# Patient Record
Sex: Female | Born: 1960 | Hispanic: Yes | Smoking: Never smoker
Health system: Southern US, Community
[De-identification: ages and names within clinical notes are randomized; demographics above are authoritative.]

## PROBLEM LIST (undated history)

## (undated) DIAGNOSIS — Z0000 Encounter for general adult medical examination without abnormal findings: Secondary | ICD-10-CM

## (undated) DIAGNOSIS — R748 Abnormal levels of other serum enzymes: Secondary | ICD-10-CM

## (undated) DIAGNOSIS — R002 Palpitations: Secondary | ICD-10-CM

## (undated) DIAGNOSIS — R739 Hyperglycemia, unspecified: Secondary | ICD-10-CM

## (undated) DIAGNOSIS — E663 Overweight: Secondary | ICD-10-CM

## (undated) DIAGNOSIS — Z8742 Personal history of other diseases of the female genital tract: Secondary | ICD-10-CM

## (undated) DIAGNOSIS — E288 Other ovarian dysfunction: Secondary | ICD-10-CM

## (undated) DIAGNOSIS — E2839 Other primary ovarian failure: Secondary | ICD-10-CM

## (undated) DIAGNOSIS — D649 Anemia, unspecified: Secondary | ICD-10-CM

## (undated) DIAGNOSIS — E538 Deficiency of other specified B group vitamins: Secondary | ICD-10-CM

## (undated) DIAGNOSIS — E079 Disorder of thyroid, unspecified: Secondary | ICD-10-CM

## (undated) DIAGNOSIS — N809 Endometriosis, unspecified: Secondary | ICD-10-CM

## (undated) DIAGNOSIS — R768 Other specified abnormal immunological findings in serum: Secondary | ICD-10-CM

## (undated) DIAGNOSIS — H409 Unspecified glaucoma: Secondary | ICD-10-CM

## (undated) DIAGNOSIS — R7689 Other specified abnormal immunological findings in serum: Secondary | ICD-10-CM

## (undated) DIAGNOSIS — M5126 Other intervertebral disc displacement, lumbar region: Secondary | ICD-10-CM

## (undated) DIAGNOSIS — Z124 Encounter for screening for malignant neoplasm of cervix: Secondary | ICD-10-CM

## (undated) DIAGNOSIS — H04123 Dry eye syndrome of bilateral lacrimal glands: Secondary | ICD-10-CM

## (undated) DIAGNOSIS — M797 Fibromyalgia: Secondary | ICD-10-CM

## (undated) HISTORY — DX: Endometriosis, unspecified: N80.9

## (undated) HISTORY — DX: Anemia, unspecified: D64.9

## (undated) HISTORY — DX: Other primary ovarian failure: E28.39

## (undated) HISTORY — DX: Other specified abnormal immunological findings in serum: R76.8

## (undated) HISTORY — DX: Other specified abnormal immunological findings in serum: R76.89

## (undated) HISTORY — DX: Hyperglycemia, unspecified: R73.9

## (undated) HISTORY — DX: Deficiency of other specified B group vitamins: E53.8

## (undated) HISTORY — DX: Unspecified glaucoma: H40.9

## (undated) HISTORY — DX: Dry eye syndrome of bilateral lacrimal glands: H04.123

## (undated) HISTORY — DX: Fibromyalgia: M79.7

## (undated) HISTORY — DX: Other ovarian dysfunction: E28.8

## (undated) HISTORY — DX: Encounter for general adult medical examination without abnormal findings: Z00.00

## (undated) HISTORY — DX: Disorder of thyroid, unspecified: E07.9

## (undated) HISTORY — DX: Abnormal levels of other serum enzymes: R74.8

## (undated) HISTORY — DX: Other intervertebral disc displacement, lumbar region: M51.26

## (undated) HISTORY — DX: Palpitations: R00.2

## (undated) HISTORY — DX: Overweight: E66.3

## (undated) HISTORY — DX: Personal history of other diseases of the female genital tract: Z87.42

## (undated) HISTORY — DX: Encounter for screening for malignant neoplasm of cervix: Z12.4

---

## 1988-03-07 HISTORY — PX: SALPINGOOPHORECTOMY: SHX82

## 1990-12-05 DIAGNOSIS — G9332 Myalgic encephalomyelitis/chronic fatigue syndrome: Secondary | ICD-10-CM | POA: Insufficient documentation

## 1990-12-05 HISTORY — DX: Myalgic encephalomyelitis/chronic fatigue syndrome: G93.32

## 2007-04-26 ENCOUNTER — Encounter (INDEPENDENT_AMBULATORY_CARE_PROVIDER_SITE_OTHER): Payer: Self-pay | Admitting: *Deleted

## 2007-05-03 ENCOUNTER — Ambulatory Visit: Payer: Self-pay | Admitting: Family Medicine

## 2007-05-03 DIAGNOSIS — S139XXA Sprain of joints and ligaments of unspecified parts of neck, initial encounter: Secondary | ICD-10-CM | POA: Insufficient documentation

## 2007-05-25 ENCOUNTER — Ambulatory Visit: Payer: Self-pay | Admitting: Family Medicine

## 2007-05-25 DIAGNOSIS — R519 Headache, unspecified: Secondary | ICD-10-CM | POA: Insufficient documentation

## 2007-05-25 DIAGNOSIS — R51 Headache: Secondary | ICD-10-CM | POA: Insufficient documentation

## 2007-05-28 ENCOUNTER — Encounter: Payer: Self-pay | Admitting: Family Medicine

## 2007-06-01 ENCOUNTER — Telehealth (INDEPENDENT_AMBULATORY_CARE_PROVIDER_SITE_OTHER): Payer: Self-pay | Admitting: *Deleted

## 2014-02-14 ENCOUNTER — Ambulatory Visit (INDEPENDENT_AMBULATORY_CARE_PROVIDER_SITE_OTHER): Payer: BC Managed Care – PPO

## 2014-02-14 ENCOUNTER — Encounter: Payer: Self-pay | Admitting: Sports Medicine

## 2014-02-14 ENCOUNTER — Ambulatory Visit (INDEPENDENT_AMBULATORY_CARE_PROVIDER_SITE_OTHER): Payer: BC Managed Care – PPO | Admitting: Sports Medicine

## 2014-02-14 VITALS — BP 105/66 | HR 66 | Ht 71.0 in | Wt 181.0 lb

## 2014-02-14 DIAGNOSIS — M79641 Pain in right hand: Secondary | ICD-10-CM

## 2014-02-14 DIAGNOSIS — M778 Other enthesopathies, not elsewhere classified: Secondary | ICD-10-CM

## 2014-02-14 DIAGNOSIS — M6588 Other synovitis and tenosynovitis, other site: Secondary | ICD-10-CM

## 2014-02-14 DIAGNOSIS — M79644 Pain in right finger(s): Secondary | ICD-10-CM | POA: Insufficient documentation

## 2014-02-14 DIAGNOSIS — M779 Enthesopathy, unspecified: Principal | ICD-10-CM

## 2014-02-14 MED ORDER — MELOXICAM 15 MG PO TABS: ORAL_TABLET | ORAL | Status: DC

## 2014-02-14 NOTE — Progress Notes (Signed)
   Subjective:    I'm seeing this patient as a consultation for:  Dr. Lelon Huh[facility]antel[facility] at [facility]  CC: Right hand pain  HPI: This is a very pleasant 53 year old left handed individual, for the past several weeks she's had an increasing pain that she localizes over the volar first metacarpal, worse with flexion and extension of the thumb. Pain is worsening, radiates from the interphalangeal joint to the base of the thenar eminence. Moderate in severity. No numbness or tingling, neck pain. No trauma.  Past medical history, Surgical history, Family history not pertinant except as noted below, Social history, Allergies, and medications have been entered into the medical record, reviewed, and no changes needed.   Review of Systems: No headache, visual changes, nausea, vomiting, diarrhea, constipation, dizziness, abdominal pain, skin rash, fevers, chills, night sweats, weight loss, swollen lymph nodes, body aches, joint swelling, muscle aches, chest pain, shortness of breath, mood changes, visual or auditory hallucinations.   Objective:   General: Well Developed, well nourished, and in no acute distress.  Neuro/Psych: Alert and oriented x3, extra-ocular muscles intact, able to move all 4 extremities, sensation grossly intact. Skin: Warm and dry, no rashes noted.  Respiratory: Not using accessory muscles, speaking in full sentences, trachea midline.  Cardiovascular: Pulses palpable, no extremity edema. Abdomen: Does not appear distended. Right hand: The thumb does appear swollen on the right side compared to the left, she has fallen active range of motion, excellent strength. I am able to reproduce pain with resisted flexion of the interphalangeal joint. She has a negative Finkelstein sign, negative Watson test, and no discrete tenderness to palpation at the interphalangeal, metacarpophalangeal or carpal metacarpal joints. She is neurovascularly intact distally. I am able to palpate  flexor pollicis longus tendon, it is tender to palpation, pain is concordant. There is no triggering.  Impression and Recommendations:   This case required medical decision making of moderate complexity.

## 2014-02-14 NOTE — Assessment & Plan Note (Signed)
We will start conservatively with thumb spica immobilization, meloxicam. I would also like some x-rays of the right hand.  Return in 2 weeks, injection if no better.

## 2014-03-03 ENCOUNTER — Ambulatory Visit (INDEPENDENT_AMBULATORY_CARE_PROVIDER_SITE_OTHER): Payer: BC Managed Care – PPO | Admitting: Sports Medicine

## 2014-03-03 ENCOUNTER — Encounter: Payer: Self-pay | Admitting: Sports Medicine

## 2014-03-03 VITALS — BP 120/66 | HR 64 | Ht 71.0 in | Wt 182.0 lb

## 2014-03-03 DIAGNOSIS — M654 Radial styloid tenosynovitis [de Quervain]: Secondary | ICD-10-CM

## 2014-03-03 NOTE — Assessment & Plan Note (Signed)
Persistent pain despite 2 weeks of immobilization in a thumb spica brace. Ultrasound guided first extensor compartment injection as above. Continue OT with her friend. Return in one month.

## 2014-03-03 NOTE — Progress Notes (Signed)
  Subjective:    CC: right wrist pain  HPI: This is a very pleasant 53 year old female, initially we diagnosed her with a flexor pollicis longus tendinitis, we placed her in a thumb spica brace, and she returns today with persistent pain however this time is localized over the dorsum of the wrist at the first extensor compartment. Pain is moderate, persistent. Burning in nature and radiates to the mid forearm.  Past medical history, Surgical history, Family history not pertinant except as noted below, Social history, Allergies, and medications have been entered into the medical record, reviewed, and no changes needed.   Review of Systems: No fevers, chills, night sweats, weight loss, chest pain, or shortness of breath.   Objective:    General: Well Developed, well nourished, and in no acute distress.  Neuro: Alert and oriented x3, extra-ocular muscles intact, sensation grossly intact.  HEENT: Normocephalic, atraumatic, pupils equal round reactive to light, neck supple, no masses, no lymphadenopathy, thyroid nonpalpable.  Skin: Warm and dry, no rashes. Cardiac: Regular rate and rhythm, no murmurs rubs or gallops, no lower extremity edema.  Respiratory: Clear to auscultation bilaterally. Not using accessory muscles, speaking in full sentences. Right Wrist: Inspection normal with no visible erythema or swelling. ROM smooth and normal with good flexion and extension and ulnar/radial deviation that is symmetrical with opposite wrist. Palpation is normal over metacarpals, navicular, lunate, and TFCC; tendons without tenderness/ swelling No snuffbox tenderness. No tenderness over Canal of Guyon. Strength 5/5 in all directions without pain. Negative Tinel's and Phalen's sign. Positive Finkelstein sign. Negative Watson's test.  Procedure: Real-time Ultrasound Guided Injection of right first extensor compartment Device: GE Logiq E  Verbal informed consent obtained.  Time-out conducted.  Noted  no overlying erythema, induration, or other signs of local infection.  Skin prepped in a sterile fashion.  Local anesthesia: Topical Ethyl chloride.  With sterile technique and under real time ultrasound guidance:  30-gauge needle advanced into the first extensor compartment which did have some edema, 1 mL kenalog 40, 2 mL lidocaine injected easily around the extensor pollicis brevis and abductor pollicis longus tendons taking care to avoid intratendinous injection. Completed without difficulty  Pain immediately resolved suggesting accurate placement of the medication.  Advised to call if fevers/chills, erythema, induration, drainage, or persistent bleeding.  Images permanently stored and available for review in the ultrasound unit.  Impression: Technically successful ultrasound guided injection.  Impression and Recommendations:

## 2014-03-31 ENCOUNTER — Ambulatory Visit: Payer: BC Managed Care – PPO | Admitting: Sports Medicine

## 2014-04-07 ENCOUNTER — Encounter: Payer: Self-pay | Admitting: Sports Medicine

## 2014-04-07 ENCOUNTER — Ambulatory Visit (INDEPENDENT_AMBULATORY_CARE_PROVIDER_SITE_OTHER): Payer: 59 | Admitting: Sports Medicine

## 2014-04-07 ENCOUNTER — Ambulatory Visit (INDEPENDENT_AMBULATORY_CARE_PROVIDER_SITE_OTHER): Payer: 59

## 2014-04-07 DIAGNOSIS — M79644 Pain in right finger(s): Secondary | ICD-10-CM

## 2014-04-07 DIAGNOSIS — M654 Radial styloid tenosynovitis [de Quervain]: Secondary | ICD-10-CM

## 2014-04-07 NOTE — Assessment & Plan Note (Signed)
First extensor compartment pain was completely resolved until a fall about 2 weeks ago. She continues to be pain free with regards to her de Quervain's tendinitis. She now has a recurrence of pain at the first interphalangeal as well as the metacarpal phalangeal joints. X-rays, referral to therapy for phonophoresis and iontophoresis. Return to see me in a month.

## 2014-04-07 NOTE — Progress Notes (Signed)
  Subjective:    CC: Follow-up  HPI: Right wrist pain: We injected her first extensor compartment at the last visit and she completely improved, unfortunately she had a fall 2 weeks ago with a recurrence of pain and she now localizes at the first interphalangeal and metacarpophalangeal joints, she tells me she had significant swelling, pain, which has steadily improved. Pain is now mild, improving.  Past medical history, Surgical history, Family history not pertinant except as noted below, Social history, Allergies, and medications have been entered into the medical record, reviewed, and no changes needed.   Review of Systems: No fevers, chills, night sweats, weight loss, chest pain, or shortness of breath.   Objective:    General: Well Developed, well nourished, and in no acute distress.  Neuro: Alert and oriented x3, extra-ocular muscles intact, sensation grossly intact.  HEENT: Normocephalic, atraumatic, pupils equal round reactive to light, neck supple, no masses, no lymphadenopathy, thyroid nonpalpable.  Skin: Warm and dry, no rashes. Cardiac: Regular rate and rhythm, no murmurs rubs or gallops, no lower extremity edema.  Respiratory: Clear to auscultation bilaterally. Not using accessory muscles, speaking in full sentences. Right hand: No tenderness over the radial styloid, with a negative Finkelstein sign. She does have pain with decreased range of motion and visible swelling in the right first interphalangeal joint, and minimal pain at the right first metacarpal phalangeal joint.  Impression and Recommendations:

## 2014-04-09 ENCOUNTER — Encounter: Payer: Self-pay | Admitting: Family

## 2014-04-09 ENCOUNTER — Ambulatory Visit (INDEPENDENT_AMBULATORY_CARE_PROVIDER_SITE_OTHER): Payer: 59 | Admitting: Family

## 2014-04-09 VITALS — BP 112/64 | HR 66 | Temp 98.0°F | Resp 16 | Ht 70.5 in | Wt 179.0 lb

## 2014-04-09 DIAGNOSIS — M5442 Lumbago with sciatica, left side: Secondary | ICD-10-CM

## 2014-04-09 DIAGNOSIS — R945 Abnormal results of liver function studies: Secondary | ICD-10-CM

## 2014-04-09 DIAGNOSIS — M654 Radial styloid tenosynovitis [de Quervain]: Secondary | ICD-10-CM

## 2014-04-09 DIAGNOSIS — B351 Tinea unguium: Secondary | ICD-10-CM

## 2014-04-09 DIAGNOSIS — M797 Fibromyalgia: Secondary | ICD-10-CM

## 2014-04-09 DIAGNOSIS — R7989 Other specified abnormal findings of blood chemistry: Secondary | ICD-10-CM

## 2014-04-09 HISTORY — DX: Lumbago with sciatica, left side: M54.42

## 2014-04-09 HISTORY — DX: Tinea unguium: B35.1

## 2014-04-09 LAB — HEPATIC FUNCTION PANEL
ALBUMIN: 4.2 g/dL (ref 3.5–5.2)
ALT: 80 U/L — ABNORMAL HIGH (ref 0–35)
AST: 33 U/L (ref 0–37)
Alkaline Phosphatase: 284 U/L — ABNORMAL HIGH (ref 39–117)
BILIRUBIN DIRECT: 0.3 mg/dL (ref 0.0–0.3)
BILIRUBIN TOTAL: 0.4 mg/dL (ref 0.2–1.2)
Total Protein: 7.4 g/dL (ref 6.0–8.3)

## 2014-04-09 NOTE — Progress Notes (Signed)
Subjective:    Patient ID: Hailey Cochran, female    DOB: [birth year], 54 y.o.   MRN: [id number]  HPI  Ms. Hailey Cochran is a 54 yr old female who presents today to establish care.   DeQuervains Tenosynovitis-  Notes that hse had pain in the right thumb.  Saw sports med.  Had steroid injection which did not help.    She reports that 2 weeks ago she tripped and fell and jammed her right thumb.  She also injured her back. She has history of herniated disc.  She sees Dr. Christell Faith chiropractic.   Has LLE tingling.  Has cramping in the left foot, right upper leg/groin. Pain is located in the middle back.  Rarely she will have symptoms in the right leg.  Denies bowel/bladder incontinence.  FM-reports hx of EBS years ago.  Reports hx of elevated LFT's.  Reports that her LMP was age 57. Menarche age 52. Reports that she was 5"1 by first grade.    Toenail fungus- reports that she has had toenail fungus for approximately 7 years. Interested in treating.      Review of Systems See HPI  Past Medical History  Diagnosis Date  . Fibromyalgia   . Lumbar herniated disc   . History of endometriosis     History   Social History  . Marital Status: Married    Spouse Name: N/A    Number of Children: N/A  . Years of Education: N/A   Occupational History  . Not on file.   Social History Main Topics  . Smoking status: Never Smoker   . Smokeless tobacco: Not on file  . Alcohol Use: No  . Drug Use: Not on file  . Sexual Activity: Not on file   Other Topics Concern  . Not on file   Social History Narrative   2 biological children 79- daughter [name]                                    89- [name]   3 miscarraiges       Adopted children:   67 year old- [name]   19 year old amelia   54 year old evan michael   7 yosiah   6 jeshua   6 kaleb daniel      Married- Samuel   Homeschools her children   Enjoys Psychologist, forensic, worshiping, dancing   Christian, jewish heritage    Past  Surgical History  Procedure Laterality Date  . Salpingoophorectomy Right 1990    Family History  Problem Relation Age of Onset  . Dementia Mother     "lewey body"  . Leukemia Mother   . Hepatitis C Father   . Heart disease Neg Hx   . Muscular dystrophy Paternal Uncle     Allergies  Allergen Reactions  . Morphine   . Other     ANTS.  Causes numbness / paralysis per pt  . Wasp Venom Other (See Comments)    Numbness / paralysis    No current outpatient prescriptions on file prior to visit.   No current facility-administered medications on file prior to visit.    BP 112/64 mmHg  Pulse 66  Temp(Src) 98 F (36.7 C) (Oral)  Resp 16  Ht 5' 10.5" (1.791 m)  Wt 179 lb (81.194 kg)  BMI 25.31 kg/m2  SpO2 99%       Objective:   Physical  Exam  Constitutional: She is oriented to person, place, and time. She appears well-developed and well-nourished.  HENT:  Head: Normocephalic and atraumatic.  Eyes: No scleral icterus.  Cardiovascular: Normal rate, regular rhythm and normal heart sounds.   No murmur heard. Pulmonary/Chest: Effort normal and breath sounds normal. No respiratory distress. She has no wheezes.  Musculoskeletal:       Cervical back: She exhibits no tenderness.       Thoracic back: She exhibits no tenderness.       Lumbar back: She exhibits no tenderness.  Lymphadenopathy:    She has no cervical adenopathy.  Neurological: She is alert and oriented to person, place, and time.  Reflex Scores:      Patellar reflexes are 2+ on the right side and 2+ on the left side. Bilateral LE strength is 5/5  Skin:  Hypertrophic toenail- right great toe. Mild discoloration of the left great toenail  Psychiatric: She has a normal mood and affect. Her behavior is normal. Judgment and thought content normal.          Assessment & Plan:  30 minutes spent with  Pt today. >50% of this time was spent counseling pt on low back pain/treatment, onychomycosis treatment, and  other health issues.

## 2014-04-09 NOTE — Assessment & Plan Note (Addendum)
We discussed possibility of prednisone and she declines due to previous intolerance of med and multiple family members who have had complications. She has rx for prn meloxicam and requests referral back to Chiropractic.  We did discuss MRI of the lumbar spine, but she wishes to see Chiropractor first.

## 2014-04-09 NOTE — Assessment & Plan Note (Signed)
At baseline, monitor ° °

## 2014-04-09 NOTE — Assessment & Plan Note (Signed)
Need to check baseline LFT's if normal, can initiate lamisil with close monitoring of LFT.

## 2014-04-09 NOTE — Assessment & Plan Note (Signed)
Referral placed to Sports med for referral purposes.

## 2014-04-09 NOTE — Patient Instructions (Signed)
You will be contacted about your referral to Chiropractic and sports medicin. Please complete your lab work prior to leaving. Schedule a complete physical at the front desk. Welcome to [facility]!

## 2014-04-10 ENCOUNTER — Ambulatory Visit (INDEPENDENT_AMBULATORY_CARE_PROVIDER_SITE_OTHER): Payer: 59

## 2014-04-10 ENCOUNTER — Telehealth: Payer: Self-pay | Admitting: Family

## 2014-04-10 DIAGNOSIS — R7989 Other specified abnormal findings of blood chemistry: Secondary | ICD-10-CM

## 2014-04-10 DIAGNOSIS — R945 Abnormal results of liver function studies: Secondary | ICD-10-CM

## 2014-04-10 DIAGNOSIS — M6281 Muscle weakness (generalized): Secondary | ICD-10-CM

## 2014-04-10 DIAGNOSIS — M25539 Pain in unspecified wrist: Secondary | ICD-10-CM

## 2014-04-10 DIAGNOSIS — M654 Radial styloid tenosynovitis [de Quervain]: Secondary | ICD-10-CM

## 2014-04-10 MED ORDER — TAVABOROLE 5 % EX SOLN: CUTANEOUS | Status: DC

## 2014-04-10 NOTE — Telephone Encounter (Signed)
Returning call.

## 2014-04-10 NOTE — Telephone Encounter (Signed)
I reviewed her blood work and her liver function testing is elevated.  I would advise against starting oral lamisil. I did send rx for topical treatment.  I would like her to have an abdominal US to evaluate her liver.  Pended below.

## 2014-04-10 NOTE — Telephone Encounter (Signed)
Spoke with patient regarding lab results.  Patient stated understanding.  Ultrasound previously released and patient notified our office will call her to schedule.

## 2014-04-10 NOTE — Telephone Encounter (Signed)
LM with family member for patient to return the call

## 2014-04-10 NOTE — Telephone Encounter (Signed)
LM for patient to return the call.  

## 2014-04-12 ENCOUNTER — Ambulatory Visit (HOSPITAL_BASED_OUTPATIENT_CLINIC_OR_DEPARTMENT_OTHER): Admission: RE | Admit: 2014-04-12 | Payer: 59 | Source: Ambulatory Visit

## 2014-04-14 ENCOUNTER — Ambulatory Visit (HOSPITAL_BASED_OUTPATIENT_CLINIC_OR_DEPARTMENT_OTHER)
Admission: RE | Admit: 2014-04-14 | Discharge: 2014-04-14 | Disposition: A | Discharge status: Home / Self Care | Payer: 59 | Source: Ambulatory Visit | Attending: Family | Admitting: Family

## 2014-04-14 DIAGNOSIS — R7989 Other specified abnormal findings of blood chemistry: Secondary | ICD-10-CM | POA: Diagnosis not present

## 2014-04-15 ENCOUNTER — Telehealth: Payer: Self-pay | Admitting: *Deleted

## 2014-04-15 DIAGNOSIS — R7989 Other specified abnormal findings of blood chemistry: Secondary | ICD-10-CM

## 2014-04-15 DIAGNOSIS — R945 Abnormal results of liver function studies: Secondary | ICD-10-CM

## 2014-04-15 DIAGNOSIS — R748 Abnormal levels of other serum enzymes: Secondary | ICD-10-CM

## 2014-04-15 NOTE — Telephone Encounter (Signed)
I would recommend that we do some additional blood work to further evaluate.  I have pended below.  Does she drink alcohol?

## 2014-04-15 NOTE — Telephone Encounter (Signed)
Notified pt. She denies alcohol intake. Reports history of elevated liver enzymes on 3 different occasions in the past. States additional workup has always turned out normal but she is willing to complete additional bloodwork. Father has history of Hep C. Pt also states that she has had a positive ANA result in the past but further testing normal. Pt has copy of previous workup and will bring copies for our records. Pending orders signed and lab appt scheduled for tomorrow at 2:30pm.

## 2014-04-15 NOTE — Telephone Encounter (Signed)
Pt called requesting recent u/s result. Advised pt u/s appears normal. Pt wants to know what the next steps should be?  Please advise.

## 2014-04-16 ENCOUNTER — Encounter: Payer: 59 | Admitting: Physical Therapy

## 2014-04-16 ENCOUNTER — Other Ambulatory Visit (INDEPENDENT_AMBULATORY_CARE_PROVIDER_SITE_OTHER): Payer: 59

## 2014-04-16 DIAGNOSIS — R7989 Other specified abnormal findings of blood chemistry: Secondary | ICD-10-CM

## 2014-04-16 DIAGNOSIS — R748 Abnormal levels of other serum enzymes: Secondary | ICD-10-CM

## 2014-04-16 DIAGNOSIS — R945 Abnormal results of liver function studies: Secondary | ICD-10-CM

## 2014-04-17 LAB — ACUTE HEP PANEL AND HEP B SURFACE AB
HCV Ab: NEGATIVE
HEP B C IGM: NONREACTIVE
Hep A IgM: NONREACTIVE
Hep B S Ab: NEGATIVE
Hepatitis B Surface Ag: NEGATIVE

## 2014-04-17 LAB — FERRITIN: Ferritin: 124.5 ng/mL (ref 10.0–291.0)

## 2014-04-17 LAB — VITAMIN D 25 HYDROXY (VIT D DEFICIENCY, FRACTURES): VITD: 19.5 ng/mL — AB (ref 30.00–100.00)

## 2014-04-17 LAB — PARATHYROID HORMONE, INTACT (NO CA): PTH: 36 pg/mL (ref 14–64)

## 2014-04-17 LAB — ANA: Anti Nuclear Antibody(ANA): NEGATIVE

## 2014-04-21 ENCOUNTER — Encounter: Payer: 59 | Admitting: Physical Therapy

## 2014-04-21 ENCOUNTER — Telehealth: Payer: Self-pay | Admitting: Family

## 2014-04-21 LAB — CERULOPLASMIN: CERULOPLASMIN: 33 mg/dL (ref 18–53)

## 2014-04-21 LAB — ALKALINE PHOSPHATASE ISOENZYMES
ALKALINE PHOSPHATASE (ALP ISO): 222 U/L — AB (ref 33–130)
Bone Isoenzymes: 3 % — ABNORMAL LOW (ref 28–66)
INTESTINAL ISOENZYMES (ALP ISO): 0 % — AB (ref 1–24)
Liver Isoenzymes: 83 % — ABNORMAL HIGH (ref 25–69)
MACROHEPATIC ISOENZYMES: 14 % — AB

## 2014-04-21 NOTE — Telephone Encounter (Signed)
Pt would like to transfer from [facility] O. to Dr. Abner GreenspanBlyth please advise  Please advise

## 2014-04-22 ENCOUNTER — Other Ambulatory Visit: Payer: Self-pay | Admitting: Family

## 2014-04-22 DIAGNOSIS — R945 Abnormal results of liver function studies: Secondary | ICD-10-CM

## 2014-04-22 DIAGNOSIS — E559 Vitamin D deficiency, unspecified: Secondary | ICD-10-CM | POA: Insufficient documentation

## 2014-04-22 DIAGNOSIS — R7989 Other specified abnormal findings of blood chemistry: Secondary | ICD-10-CM

## 2014-04-22 HISTORY — DX: Other specified abnormal findings of blood chemistry: R79.89

## 2014-04-22 HISTORY — DX: Vitamin D deficiency, unspecified: E55.9

## 2014-04-22 MED ORDER — VITAMIN D (ERGOCALCIFEROL) 1.25 MG (50000 UNIT) PO CAPS: 50000.0000 [IU] | ORAL_CAPSULE | ORAL | Status: DC

## 2014-04-22 NOTE — Telephone Encounter (Signed)
Please contact patient and let her know that I reviewed the lab work that she completed recently. Iron level normal, hepatitis screen normal, lupus screen ANA negative. I would recommend GI consult to further evaluate her abnormal liver function testing if she is agreeable.  Vit D is low.  This is likely cause for her elevated alk phos level.  I would recommend that she start vit D supplement once weekly for 12 weeks and then repeat vit D level.

## 2014-04-22 NOTE — Telephone Encounter (Signed)
OK with me, if OK with M

## 2014-04-22 NOTE — Telephone Encounter (Signed)
Notified pt and she voices understanding. Lab appt scheduled for 07/15/14 at 4pm, future order entered. Pt is agreeable to proceed with GI referral. Pt requested copy of lab results and mychart activation code. Results and code mailed to pt.

## 2014-04-22 NOTE — Telephone Encounter (Signed)
OK with me.

## 2014-04-23 ENCOUNTER — Telehealth: Payer: Self-pay | Admitting: Family

## 2014-04-23 NOTE — Telephone Encounter (Signed)
[facility] at [facility], he states there was an issue with [employer] insurance and they will only cover 30 day supply.

## 2014-04-23 NOTE — Telephone Encounter (Signed)
Spoke with pt and reviewed vit D level with her again. Pt states the pharmacy only gave her 4 tablets with a partial refill. I advised pt that we sent Rx for # 12 capsules x no refills.  Will call pharmacy to clarify.

## 2014-04-23 NOTE — Telephone Encounter (Signed)
Caller name: Dahlia Clienthannah Relation to pt: self Call back number: [phone] or [phone] Pharmacy:  Reason for call:   Patient states that she has a lab appointment for vit d scheduled in may. She wants to know why she has to wait so long after taking vit d med to recheck lab? She states that she only has a month worth of vit d.

## 2014-04-23 NOTE — Telephone Encounter (Signed)
error:[id number] ° °

## 2014-04-28 ENCOUNTER — Ambulatory Visit (INDEPENDENT_AMBULATORY_CARE_PROVIDER_SITE_OTHER): Payer: 59 | Admitting: Physical Therapy

## 2014-04-28 ENCOUNTER — Encounter: Payer: Self-pay | Admitting: Physical Therapy

## 2014-04-28 DIAGNOSIS — M25531 Pain in right wrist: Secondary | ICD-10-CM

## 2014-04-28 DIAGNOSIS — R29898 Other symptoms and signs involving the musculoskeletal system: Secondary | ICD-10-CM

## 2014-04-28 DIAGNOSIS — M25541 Pain in joints of right hand: Secondary | ICD-10-CM

## 2014-04-28 NOTE — Patient Instructions (Signed)
AROM: Thumb Abduction / Adduction   Actively pull right thumb away from palm as far as possible, place thumb and fingers on thigh and push down into a stretch. Hold _30___ seconds. Then bring thumb back to touch fingers. Try not to bend fingers toward thumb. Repeat __1-2__ times per set. Do __1__ sets per session. Do _4-5___ sessions per day.  Copyright  VHI. All rights reserved.   Also perform cross friction massage to your web space.  This will be sore however it is needed to break up the scar tissue.

## 2014-04-28 NOTE — Therapy (Signed)
[facility] [place] [address] [place], [place], [address] Phone: [phone]   Fax:  [phone]  Physical Therapy Treatment  Patient Details  Name: Hailey Cochran MRN: [id number] Date of Birth: August 24, 1960 Referring Provider:  Monica Becton,*  Encounter Date: 04/28/2014      PT End of Session - 04/28/14 1437    Visit Number 2   Number of Visits 8   Date for PT Re-Evaluation 05/08/14   PT Start Time 1400   PT Stop Time 1437   PT Time Calculation (min) 37 min   Activity Tolerance Patient tolerated treatment well      Past Medical History  Diagnosis Date  . Fibromyalgia   . Lumbar herniated disc   . History of endometriosis     Past Surgical History  Procedure Laterality Date  . Salpingoophorectomy Right 1990    There were no vitals taken for this visit.  Visit Diagnosis:  Pain in joint, forearm, right  Pain in thumb joint with movement, right  Weakness of hand      Subjective Assessment - 04/28/14 1408    Symptoms Rt thumb feels full and tight, not catching or popping.    Currently in Pain? Yes   Pain Score 3    Pain Location Hand   Pain Orientation Right   Pain Descriptors / Indicators Shooting;Aching;Dull   Pain Type Chronic pain   Pain Frequency Constant   Aggravating Factors  hitting the thumb.   Pain Relieving Factors ice helps some however heat feels better.           [facility] PT Assessment - 04/28/14 0001    Assessment   Medical Diagnosis Rt thumb dequesvains   ROM / Strength   AROM / PROM / Strength AROM   AROM   AROM Assessment Site Thumb                  [facility] Adult PT Treatment/Exercise - 04/28/14 0001    Exercises   Exercises Hand   Hand Exercises   Other Hand Exercises stretching Rt hand web space, finger opposition.   Modalities   Modalities Ultrasound   Ultrasound   Ultrasound Location Rt thumb, web space   Ultrasound Parameters 50% , 3.68mhz, 1.0w/cm2   Ultrasound Goals Pain  break up scar tissue   Manual Therapy   Manual Therapy Myofascial release;Joint mobilization   Joint Mobilization grade II mobs Lt thumb PIP   Myofascial Release to web space to break up scar tissue                PT Education - 04/28/14 1446    Education provided Yes   Education Details HEP   Person(s) Educated Patient   Methods Explanation;Demonstration;Handout   Comprehension Returned demonstration             PT Long Term Goals - 04/28/14 1447    PT LONG TERM GOAL #1   Title demonstrate and/or verbalize techniques to reduce the risk of re-injury to include info on physical activity   Time 4   Period Weeks   Status On-going   PT LONG TERM GOAL #2   Title I with advanced HEP   Time 4   Period Weeks   Status On-going   PT LONG TERM GOAL #3   Title report pain decrease no greater than 1/10   Time 4   Period Weeks   Status On-going   PT LONG TERM GOAL #4   Title  be able to cook   Time 4   Period Weeks   Status On-going   PT LONG TERM GOAL #5   Title improve FOTO =/< 26% limited   Time 4   Period Weeks   Status On-going   Additional Long Term Goals   Additional Long Term Goals Yes   PT LONG TERM GOAL #6   Title be able to open jars without pain   Time 4   Period Weeks   Status On-going               Problem List Patient Active Problem List   Diagnosis Date Noted  . Vitamin D deficiency 04/22/2014  . Abnormal liver function test 04/22/2014  . Onychomycosis 04/09/2014  . Low back pain with left-sided sciatica 04/09/2014  . Fibromyalgia 04/09/2014  . De Quervain's tenosynovitis, right 02/14/2014    Manfred ShirtsSusan Marquavion Venhuizen,PT 04/28/2014, 2:52 PM  [facility] [address] [place], [place], [address] Phone: [phone]   Fax:  [phone]

## 2014-05-05 ENCOUNTER — Ambulatory Visit (INDEPENDENT_AMBULATORY_CARE_PROVIDER_SITE_OTHER): Payer: 59 | Admitting: Sports Medicine

## 2014-05-05 ENCOUNTER — Ambulatory Visit (INDEPENDENT_AMBULATORY_CARE_PROVIDER_SITE_OTHER): Payer: 59 | Admitting: Nurse Practitioner

## 2014-05-05 ENCOUNTER — Encounter: Payer: Self-pay | Admitting: Sports Medicine

## 2014-05-05 ENCOUNTER — Encounter: Payer: Self-pay | Admitting: Nurse Practitioner

## 2014-05-05 VITALS — BP 100/70 | HR 76 | Ht 71.0 in | Wt 179.6 lb

## 2014-05-05 VITALS — BP 100/70 | Ht 71.0 in | Wt 180.0 lb

## 2014-05-05 DIAGNOSIS — R945 Abnormal results of liver function studies: Secondary | ICD-10-CM

## 2014-05-05 DIAGNOSIS — R7989 Other specified abnormal findings of blood chemistry: Secondary | ICD-10-CM

## 2014-05-05 DIAGNOSIS — M79644 Pain in right finger(s): Secondary | ICD-10-CM

## 2014-05-05 NOTE — Progress Notes (Signed)
HPI :  Patient is a 54 year old female referred by PCP for evaluation of abnormal liver function studies. Alkaline phosphatase on the third of this month was 284. ALT 80, remaining LFTs normal.   Patient gives a history of elevated LFTs dating back to the 1990's when she had EBV. LFTs were elevated at that time, antinuclear antibody was positive per patient. Liver function studies apparently normalized. Then, in several years later patient had a miscarriage and again found to have elevated LFTs.  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 Over the years patient has been told that liver enzymes were mildly elevated. Liver biopsy was discussed at some point but never done. No [place] of liver disease.  Labs this month:  Ferritin 124. Ceruloplasmin normal. ANA negative, Hepatitis C antibody negative. Hep B surface antigen negative.  Her alk phos isoenzymes points to a liver origin.   2wk ago      Alkaline Phonsphatase 33 - 130 U/L 222 (H)   Intestinal Isoenzymes 1 - 24 % 0 (L)   Bone Isoenzymes 28 - 66 % 3 (L)   Liver Isoenzymes 25 - 69 % 83 (H)   Macrohepatic isoenzymes 0 % 14 (H)         Past Medical History  Diagnosis Date  . Fibromyalgia   . Lumbar herniated disc   . History of endometriosis     Family History  Problem Relation Age of Onset  . Dementia Mother     "lewey body"  . Leukemia Mother   . Hepatitis C Father   . Heart disease Neg Hx   . Muscular dystrophy Paternal Uncle    History  Substance Use Topics  . Smoking status: Never Smoker   . Smokeless tobacco: Never Used  . Alcohol Use: No   Current Outpatient Prescriptions  Medication Sig Dispense Refill  . EPINEPHrine 0.3 mg/0.3 mL IJ SOAJ injection  Inject 0.3 mg into the muscle as needed.    . Ginkgo Biloba Extract 60 MG CAPS Take 60 mg by mouth daily.    Marland Kitchen L-THEANINE PO Take 200 mg by mouth daily.    . Maca 500 MG CAPS Take 500 mg by mouth daily.    Marland Kitchen OVER THE COUNTER MEDICATION Take by mouth daily. CHASTE TREE (VITEX EXTRACT PO)    . OVER THE COUNTER MEDICATION Take 1 capsule by mouth daily. HOPPS (for hot flashes)    . OVER THE COUNTER MEDICATION GINSENG.  Take 2 daily.    . Vitamin D, Ergocalciferol, (DRISDOL) 50000 UNITS CAPS capsule Take 1 capsule (50,000 Units total) by mouth every 7 (seven) days. 12 capsule 0  . Tavaborole (KERYDIN) 5 % SOLN Apply to clean dry nail and beneath nail once daily for 48 weeks. (Patient not taking: Reported on 05/05/2014) 10 mL 11   No current facility-administered medications for this visit.   Allergies  Allergen Reactions  . Morphine   . Other     ANTS.  Causes numbness / paralysis per pt  . Wasp Venom Other (See Comments)    Numbness / paralysis     Review of Systems: All systems reviewed and negative except where noted in HPI.   US Abdomen Complete  04/14/2014   CLINICAL DATA:  Abnormal LFT.  EXAM: ULTRASOUND ABDOMEN COMPLETE  COMPARISON:  None.  FINDINGS: Gallbladder: No gallstones or wall thickening visualized. No sonographic Murphy sign noted.  Common bile duct: Diameter: 2 mm  Liver: No focal lesion identified. Within normal limits in parenchymal echogenicity.  IVC: Limited  Pancreas: Limited  Spleen: Size and appearance within normal limits.  Right Kidney: Length: 11.1 cm. Echogenicity within normal limits. No mass or hydronephrosis visualized.  Left Kidney: Length: 10.4 cm. Echogenicity within normal limits. No mass or hydronephrosis visualized.  Abdominal aorta: No aneurysm visualized.  Other findings: None.  IMPRESSION: Negative.  Exam is limited by bowel gas.   Electronically Signed   By: Franchot Gallo M.D.   On: 04/14/2014 11:25   Physical Exam: BP 100/70 mmHg  Pulse 76  Ht 5' 11"   (1.803 m)  Wt 179 lb 9.6 oz (81.466 kg)  BMI 25.06 kg/m2 Constitutional: Pleasant,well-developed, white female in no acute distress. HEENT: Normocephalic and atraumatic. Conjunctivae are normal. No scleral icterus. Neck supple.  Cardiovascular: Normal rate, regular rhythm.  Pulmonary/chest: Effort normal and breath sounds normal. No wheezing, rales or rhonchi. Abdominal: Soft, nondistended, nontender. Bowel sounds active throughout. There are no masses palpable. No hepatomegaly. Extremities: no edema Lymphadenopathy: No cervical adenopathy noted. Neurological: Alert and oriented to person place and time. Skin: Skin is warm and dry. No rashes noted. Psychiatric: Normal mood and affect. Behavior is normal.   ASSESSMENT AND PLAN:    54 year old female abnormal LFTs in 2002 and this again month. I don't have labs from in between years but patient reports intermittent mildly elevated enzymes over the years. She had a negative autoimmune / genetic workup for elevated LFTs [place].   No obvious cause for abnormal LFTs. Her recent ultrasound was negative. Viral hep B,C / ferritin / ceruloplasmin and ANA all negative. Her ALT is 80, alk phos 284. Alk phos isoenzymes suggest liver origin.   I would like her to discontinue herbal supplements and have repeat LFTs in 6-8 weeks since I only have one set of LFTs to go by. She will eliminate ginseng and ginkgo biloba but really wants to continue the remaining herbs as they help with hot flashes.She will avoid NSAIDS and any new medications for the time being. I think she she will eventually need a liver biopsy.   Cc: Debbrah Alar, NP

## 2014-05-05 NOTE — Patient Instructions (Addendum)
Have the Hepatic panel done at your Primiary Care Physician's office.  Dr. Lynder ParentsMielissa O'Sullivan.    No alcohol.  No Mobic, or NSAIDS ( Aleve, Ibuprofen, Advil ) No Gingo, No GInseng, No Vitex. No milk thistle.

## 2014-05-05 NOTE — Assessment & Plan Note (Signed)
De Quervain's tendinitis has completely resolved. Continued pain but this time at the right first metacarpal phalangeal joint, it is swollen, and on ultrasound does appear to have a synovitis. MCP injection as above. Return to see me in 6 weeks.

## 2014-05-05 NOTE — Progress Notes (Signed)
  Subjective:    CC: Follow-up  HPI: Right thumb pain: Responded extremely well with complete resolution of symptoms after a de Quervain's injection, no further pain in the distribution of the first extensor compartment, subsequently had a fall with pain at the first metacarpal phalangeal joint as well as swelling. We did watchful waiting, and she returns with persistent pain and swelling in this joint. Moderate, persistent without radiation.  Past medical history, Surgical history, Family history not pertinant except as noted below, Social history, Allergies, and medications have been entered into the medical record, reviewed, and no changes needed.   Review of Systems: No fevers, chills, night sweats, weight loss, chest pain, or shortness of breath.   Objective:    General: Well Developed, well nourished, and in no acute distress.  Neuro: Alert and oriented x3, extra-ocular muscles intact, sensation grossly intact.  HEENT: Normocephalic, atraumatic, pupils equal round reactive to light, neck supple, no masses, no lymphadenopathy, thyroid nonpalpable.  Skin: Warm and dry, no rashes. Cardiac: Regular rate and rhythm, no murmurs rubs or gallops, no lower extremity edema.  Respiratory: Clear to auscultation bilaterally. Not using accessory muscles, speaking in full sentences. Right hand: Tender to palpation at the metacarpal phalangeal joint, of the thumb, with visible swelling.  Procedure: Real-time Ultrasound Guided Injection of right first metacarpal phalangeal joint Device: GE Logiq E  Verbal informed consent obtained.  Time-out conducted.  Noted no overlying erythema, induration, or other signs of local infection.  Skin prepped in a sterile fashion.  Local anesthesia: Topical Ethyl chloride.  With sterile technique and under real time ultrasound guidance:  Noted significant synovitis on ultrasound, 30-gauge needle advanced into the joint and 0.5 mL kenalog 40, 0.5 mL lidocaine injected  easily. Completed without difficulty  Pain immediately resolved suggesting accurate placement of the medication.  Advised to call if fevers/chills, erythema, induration, drainage, or persistent bleeding.  Images permanently stored and available for review in the ultrasound unit.  Impression: Technically successful ultrasound guided injection.  Impression and Recommendations:

## 2014-05-06 ENCOUNTER — Encounter: Payer: Self-pay | Admitting: Nurse Practitioner

## 2014-05-07 ENCOUNTER — Ambulatory Visit (INDEPENDENT_AMBULATORY_CARE_PROVIDER_SITE_OTHER): Payer: 59 | Admitting: Physical Therapy

## 2014-05-07 DIAGNOSIS — M25541 Pain in joints of right hand: Secondary | ICD-10-CM

## 2014-05-07 DIAGNOSIS — R29898 Other symptoms and signs involving the musculoskeletal system: Secondary | ICD-10-CM

## 2014-05-07 DIAGNOSIS — M25531 Pain in right wrist: Secondary | ICD-10-CM

## 2014-05-07 NOTE — Progress Notes (Signed)
Reviewed and agree with management plan.  Elizebeth Kluesner T. Swannie Milius, MD FACG 

## 2014-05-07 NOTE — Therapy (Signed)
[facility]  [place], [place], [address] Phone: [phone]   Fax:  [phone]  Physical Therapy Treatment  Patient Details  Name: Hailey Cochran MRN: [id number] Date of Birth: 1960-08-02 Referring Provider:  Silverio Decamp,*  Encounter Date: 05/07/2014      PT End of Session - 05/07/14 1407    Visit Number 3   Number of Visits 8   Date for PT Re-Evaluation 05/08/14   PT Start Time 6962   PT Stop Time 1445   PT Time Calculation (min) 40 min   Activity Tolerance Patient tolerated treatment well      Past Medical History  Diagnosis Date  . Fibromyalgia   . Lumbar herniated disc   . History of endometriosis     Past Surgical History  Procedure Laterality Date  . Salpingoophorectomy Right 1990    There were no vitals taken for this visit.  Visit Diagnosis:  Pain in joint, forearm, right  Pain in thumb joint with movement, right  Weakness of hand      Subjective Assessment - 05/07/14 1408    Symptoms Received injection in 1st PIP 2 days ago. Driving home felt pop in joint, excruciating pain.  Pt noted decreased motion in Rt thumb since then.    Currently in Pain? Yes  when moving thumb.    Pain Score 2    Pain Location Finger (Comment which one)  Rt thumb    Pain Descriptors / Indicators Tightness;Dull   Aggravating Factors  movement, especially Abduction    Pain Relieving Factors Ice.           [facility] PT Assessment - 05/07/14 0001    ROM / Strength   AROM / PROM / Strength Strength   Strength   Overall Strength Comments Rt thumb Flex, ext, and ABD 3+/5   Strength Assessment Site Hand   Right/Left hand Right;Left   Grip (lbs) 58   Right Hand Lateral Pinch 8 lbs   Grip (lbs) 62   Left Hand Lateral Pinch 10 lbs                  [facility] Adult PT Treatment/Exercise - 05/07/14 0001    Hand Exercises   MCPJ Extension Strengthening;Right;15 reps  with bands from soft  grip ball.    PIPJ Flexion Strengthening;Right  Thumb, flexion into yellow theraband x 20 reps     --   Other Hand Exercises --  soft grip ball - x 25 squeezes    Other Hand Exercises Stretches for Rt thumb in all 4 directions, held for 30 sec. each    Modalities   Modalities Ultrasound   Ultrasound   Ultrasound Location Rt thumb, PIP and webspace    Ultrasound Parameters 50%, 3.3 mHz, 1.0 w/cm2 x 8 min    Ultrasound Goals Edema;Pain           PT Long Term Goals - 04/28/14 1447    PT LONG TERM GOAL #1   Title demonstrate and/or verbalize techniques to reduce the risk of re-injury to include info on physical activity   Time 4   Period Weeks   Status On-going   PT LONG TERM GOAL #2   Title I with advanced HEP   Time 4   Period Weeks   Status On-going   PT LONG TERM GOAL #3   Title report pain decrease no greater than 1/10   Time 4   Period Weeks   Status  On-going   PT LONG TERM GOAL #4   Title be able to cook   Time 4   Period Weeks   Status On-going   PT LONG TERM GOAL #5   Title improve FOTO =/< 26% limited   Time 4   Period Weeks   Status On-going   Additional Long Term Goals   Additional Long Term Goals Yes   PT LONG TERM GOAL #6   Title be able to open jars without pain   Time 4   Period Weeks   Status On-going           Plan - 05/07/14 1628    Clinical Impression Statement Pt demonstrated improved grip strength this visit, however presented with decreased Rt thumb ROM.  Pt tolerated new exercises well and reported decreased pain after treatment. No new goal met secondary to number of visits.  Pt continues with decreased Rt thumb ROM and strength; would benefit from continued PT intervention.    PT Frequency 2x / week   PT Duration 4 weeks   PT Treatment/Interventions Cryotherapy;Ultrasound;Therapeutic exercise;Electrical Stimulation;Manual techniques;Patient/family education   PT Next Visit Plan Spoke to supervising PT regarding progress towards  goals and pt's interest in continuing PT sessions.    Consulted and Agree with Plan of Care Patient        Problem List Patient Active Problem List   Diagnosis Date Noted  . Vitamin D deficiency 04/22/2014  . Abnormal liver function test 04/22/2014  . Onychomycosis 04/09/2014  . Low back pain with left-sided sciatica 04/09/2014  . Fibromyalgia 04/09/2014  . Pain of right thumb 02/14/2014    Kerin Perna, PTA 05/07/2014 4:45 PM  [facility] [place], [place], [address] Phone: [phone]   Fax:  [phone]

## 2014-05-07 NOTE — Patient Instructions (Signed)
Hand squeezes with play dough - pinch towards each finger x 20 reps x 2 sets, 1-2 x/day  Thumb stretches in all directions - hold 15-30 sec x 2 reps, 2-3 x /day  Yellow band exercises, "sling shot".  Pull thumb down through yellow band towards 1st finger. X 10 x 2 sets, 1x/day. ,    Pull thumb up against rubber band x 10 reps x 2 sets, , 1x/day.

## 2014-05-14 ENCOUNTER — Ambulatory Visit (INDEPENDENT_AMBULATORY_CARE_PROVIDER_SITE_OTHER): Payer: 59 | Admitting: Physical Therapy

## 2014-05-14 DIAGNOSIS — M79644 Pain in right finger(s): Secondary | ICD-10-CM

## 2014-05-14 DIAGNOSIS — M6289 Other specified disorders of muscle: Secondary | ICD-10-CM

## 2014-05-14 DIAGNOSIS — M25531 Pain in right wrist: Secondary | ICD-10-CM

## 2014-05-14 DIAGNOSIS — R29898 Other symptoms and signs involving the musculoskeletal system: Secondary | ICD-10-CM

## 2014-05-14 DIAGNOSIS — M25541 Pain in joints of right hand: Secondary | ICD-10-CM

## 2014-05-14 NOTE — Therapy (Signed)
[facility], [place], [address] Phone: [phone]   Fax:  [phone]  Physical Therapy Treatment  Patient Details  Name: Hailey Cochran MRN: [id number] Date of Birth: 05/07/1960 Referring Provider:  Silverio Decamp,*  Encounter Date: 05/14/2014      PT End of Session - 05/14/14 1407    Visit Number 4   Number of Visits 8   Date for PT Re-Evaluation 05/08/14   PT Start Time 9628   PT Stop Time 1452   PT Time Calculation (min) 48 min      Past Medical History  Diagnosis Date  . Fibromyalgia   . Lumbar herniated disc   . History of endometriosis     Past Surgical History  Procedure Laterality Date  . Salpingoophorectomy Right 1990    There were no vitals taken for this visit.  Visit Diagnosis:  Pain in joint, forearm, right  Pain in thumb joint with movement, right  Weakness of hand      Subjective Assessment - 05/14/14 1409    Symptoms Pt reports right thumb is "caught up", feels like something is stuck.  Pt reports she has pain in Rt thumb if bumped or touched directly on joint. Pt is frustrated she doesn't have the grip.    Currently in Pain? No/denies   Aggravating Factors  movement of abduction; bumping.    Pain Relieving Factors warmth          [facility] PT Assessment - 05/14/14 0001    Strength   Grip (lbs) 59   Right Hand Lateral Pinch 7 lbs   Grip (lbs) 65   Left Hand Lateral Pinch 8 lbs                  [facility] Adult PT Treatment/Exercise - 05/14/14 0001    Hand Exercises   MCPJ Extension Right;10 reps;Strengthening  manual resistance, 2 sets    PIPJ Flexion Strengthening;Right  Thumb, manual resistance x 2 sets   Other Hand Exercises Thumb ABD and ext against manual resistance 2 x 10 reps    Other Hand Exercises Stretches for Rt thumb in all 4 directions, held for 30 sec. each    Modalities   Modalities Ultrasound   Ultrasound   Ultrasound  Location R thumb, dorsal surface (over ext pollis tendons)   Ultrasound Parameters 50%, 3.3 mHz, 1.0 w/cm2    Ultrasound Goals Edema;Pain   Manual Therapy   Manual Therapy Other (comment)   Other Manual Therapy cross fiber friction to flexor pollis brevis and longus at thenar emmenance. Followed by 3 min to ice massage to same area. 3 Small strips of ktape applied to proximal phalanx of thumb to assist in decreased edema.                 PT Education - 05/14/14 1458    Education provided Yes   Education Details Self care: pt to trial ice and self massage to web space. Pt to continue current HEP.    Person(s) Educated Patient   Methods Explanation;Demonstration   Comprehension Verbalized understanding             PT Long Term Goals - 05/14/14 1509    PT LONG TERM GOAL #1   Title demonstrate and/or verbalize techniques to reduce the risk of re-injury to include info on physical activity   Time 4   Period Weeks   Status On-going   PT LONG TERM GOAL #2  Title I with advanced HEP   Time 4   Period Weeks   Status On-going   PT LONG TERM GOAL #3   Title report pain decrease no greater than 1/10   Time 4   Period Weeks   Status Achieved   PT LONG TERM GOAL #4   Title be able to cook   Time 4   Period Weeks   Status On-going   PT LONG TERM GOAL #5   Title improve FOTO =/< 26% limited   Time 4   Period Weeks   Status On-going   PT LONG TERM GOAL #6   Title be able to open jars without pain   Time 4   Period Weeks   Status On-going               Plan - 05/14/14 1502    Clinical Impression Statement Pt demonstrated improved Rt thumb ROM and strength, however grip strength only slightly improved. Pt reported thumb feeling "looser" and with improved motion after manual therapy/US. Pt has met LTG #3; will benefit from continued PT intervention to max I.    PT Frequency 2x / week   PT Duration 4 weeks   PT Treatment/Interventions  Cryotherapy;Ultrasound;Therapeutic exercise;Electrical Stimulation;Manual techniques;Patient/family education   PT Next Visit Plan Continue manual/ [place] and strengthening of Rt thumb.    Consulted and Agree with Plan of Care Patient        Problem List Patient Active Problem List   Diagnosis Date Noted  . Vitamin D deficiency 04/22/2014  . Abnormal liver function test 04/22/2014  . Onychomycosis 04/09/2014  . Low back pain with left-sided sciatica 04/09/2014  . Fibromyalgia 04/09/2014  . Pain of right thumb 02/14/2014   Kerin Perna, PTA 05/14/2014 3:10 PM  [facility] [place] [address] [place], [place], [address] Phone: [phone]   Fax:  [phone]

## 2014-05-16 ENCOUNTER — Encounter: Payer: 59 | Admitting: Physical Therapy

## 2014-05-20 ENCOUNTER — Ambulatory Visit (INDEPENDENT_AMBULATORY_CARE_PROVIDER_SITE_OTHER): Payer: 59 | Admitting: Physical Therapy

## 2014-05-20 DIAGNOSIS — M6289 Other specified disorders of muscle: Secondary | ICD-10-CM

## 2014-05-20 DIAGNOSIS — R29898 Other symptoms and signs involving the musculoskeletal system: Secondary | ICD-10-CM

## 2014-05-20 DIAGNOSIS — M25541 Pain in joints of right hand: Secondary | ICD-10-CM

## 2014-05-20 DIAGNOSIS — M25531 Pain in right wrist: Secondary | ICD-10-CM

## 2014-05-20 DIAGNOSIS — M79644 Pain in right finger(s): Secondary | ICD-10-CM

## 2014-05-20 NOTE — Therapy (Addendum)
[facility], [place], [address] Phone: [phone]   Fax:  [phone]  Physical Therapy Treatment  Patient Details  Name: Hailey Cochran MRN: [id number] Date of Birth: 28-Feb-1961 Referring Provider:  Silverio Decamp,*  Encounter Date: 05/20/2014      PT End of Session - 05/20/14 0855    Visit Number 5   Number of Visits 8   PT Start Time 2993   PT Stop Time 0932   PT Time Calculation (min) 38 min      Past Medical History  Diagnosis Date  . Fibromyalgia   . Lumbar herniated disc   . History of endometriosis     Past Surgical History  Procedure Laterality Date  . Salpingoophorectomy Right 1990    There were no vitals filed for this visit.  Visit Diagnosis:  Weakness of hand  Pain in thumb joint with movement, right  Pain in joint, forearm, right      Subjective Assessment - 05/20/14 0856    Symptoms Pt reports tightness, "like there is fluid in it where he put the shot" in her distal joint of Rt thumb.  Pt reports ice massage has been helping.    Currently in Pain? No/denies            [facility] PT Assessment - 05/20/14 0001    Assessment   Medical Diagnosis Rt thumb dequesvains   ROM / Strength   AROM / PROM / Strength Strength   Strength   Overall Strength Comments Rt thumb Flex, ext, and ABD 4+/5   Strength Assessment Site Hand   Right/Left hand Right;Left   Grip (lbs) 60   Right Hand Lateral Pinch 8 lbs   Grip (lbs) 62   Left Hand Lateral Pinch 9 lbs                   [facility] Adult PT Treatment/Exercise - 05/20/14 0001    Hand Exercises   DIPJ Extension Right;Strengthening;10 reps  2 sets, soft ball with ext bands   Other Hand Exercises firm ball squeezes x 20    Other Hand Exercises Stretches for Rt thumb in all 4 directions, held for 30 sec. each    Modalities   Modalities Ultrasound   Ultrasound   Ultrasound Location Rt thumb, DIP and thenar  emmenance   Ultrasound Parameters 50%, 3.3 mHz, 1.0 w/cm2    Ultrasound Goals Edema;Pain   Manual Therapy   Manual Therapy Other (comment)   Other Manual Therapy cross fiber friction to thumb flexor tendon and thenar emmenance.  KTape applied to Rt thumb DIP to decrease edema            PT Long Term Goals - 05/20/14 0916    PT LONG TERM GOAL #1   Title demonstrate and/or verbalize techniques to reduce the risk of re-injury to include info on physical activity   Time 4   Period Weeks   Status Achieved   PT LONG TERM GOAL #2   Title I with advanced HEP   Time 4   Period Weeks   Status Achieved   PT LONG TERM GOAL #3   Title report pain decrease no greater than 1/10   Time 4   Period Weeks   PT LONG TERM GOAL #4   Title be able to cook   Time 4   Period Weeks   Status Achieved   PT LONG TERM GOAL #5   Title improve FOTO =/<  26% limited   Period Weeks   Status On-going   PT LONG TERM GOAL #6   Title be able to open jars without pain   Time 4   Period Weeks   Status On-going               Plan - 05/20/14 1309    Clinical Impression Statement Pt demonstrated improved grip and strength in Rt from last visit. Still cannot open jars and reports difficulty with ADLs. Pt reports improved function of thumb after therapy sessions. Pt has met LTG #1 and 2.    PT Frequency 2x / week   PT Duration 4 weeks   PT Treatment/Interventions Cryotherapy;Ultrasound;Therapeutic exercise;Electrical Stimulation;Manual techniques;Patient/family education   PT Next Visit Plan Continue manual/ [place] and strengthening of Rt thumb.    Consulted and Agree with Plan of Care Patient        Problem List Patient Active Problem List   Diagnosis Date Noted  . Vitamin D deficiency 04/22/2014  . Abnormal liver function test 04/22/2014  . Onychomycosis 04/09/2014  . Low back pain with left-sided sciatica 04/09/2014  . Fibromyalgia 04/09/2014  . Pain of right thumb 02/14/2014    Kerin Perna, PTA 05/20/2014 1:14 PM  [facility] [place], [place], [address] Phone: [phone]   Fax:  [phone]    PHYSICAL THERAPY DISCHARGE SUMMARY  Visits from Start of Care: 5  Current functional level related to goals / functional outcomes: *see above   Remaining deficits: unknown   Education / Equipment: *HEP Plan: Patient agrees to discharge.  Patient goals were partially met. Patient is being discharged due to not returning since the last visit.  ?????    Jeral Pinch, PT

## 2014-05-23 ENCOUNTER — Encounter: Payer: 59 | Admitting: Physical Therapy

## 2014-05-28 ENCOUNTER — Encounter: Payer: 59 | Admitting: Physical Therapy

## 2014-06-16 ENCOUNTER — Ambulatory Visit: Payer: 59 | Admitting: Sports Medicine

## 2014-07-15 ENCOUNTER — Other Ambulatory Visit: Payer: 59

## 2014-08-05 ENCOUNTER — Other Ambulatory Visit (INDEPENDENT_AMBULATORY_CARE_PROVIDER_SITE_OTHER): Payer: 59

## 2014-08-05 ENCOUNTER — Telehealth: Payer: Self-pay | Admitting: Family

## 2014-08-05 DIAGNOSIS — R7989 Other specified abnormal findings of blood chemistry: Secondary | ICD-10-CM

## 2014-08-05 DIAGNOSIS — E559 Vitamin D deficiency, unspecified: Secondary | ICD-10-CM | POA: Diagnosis not present

## 2014-08-05 DIAGNOSIS — R945 Abnormal results of liver function studies: Secondary | ICD-10-CM

## 2014-08-05 LAB — VITAMIN D 25 HYDROXY (VIT D DEFICIENCY, FRACTURES): VITD: 26.52 ng/mL — ABNORMAL LOW (ref 30.00–100.00)

## 2014-08-05 LAB — HEPATIC FUNCTION PANEL
ALT: 135 U/L — AB (ref 0–35)
AST: 96 U/L — ABNORMAL HIGH (ref 0–37)
Albumin: 4.1 g/dL (ref 3.5–5.2)
Alkaline Phosphatase: 163 U/L — ABNORMAL HIGH (ref 39–117)
Bilirubin, Direct: 0.2 mg/dL (ref 0.0–0.3)
Total Bilirubin: 0.4 mg/dL (ref 0.2–1.2)
Total Protein: 7.6 g/dL (ref 6.0–8.3)

## 2014-08-05 NOTE — Addendum Note (Signed)
Addended by: Eustace QuailEABOLD, Arrin Ishler J on: 08/05/2014 11:58 AM   Modules accepted: Orders

## 2014-08-05 NOTE — Telephone Encounter (Signed)
Notified Hailey Cochran in the lab and he will add test to specimen drawn this morning.

## 2014-08-05 NOTE — Telephone Encounter (Signed)
See 05/05/14 office note with GI and advise?

## 2014-08-05 NOTE — Telephone Encounter (Signed)
Caller name: Dahlia ClientHannah Relationship to patient: self Can be reached: [phone]  Reason for call: coming in today for vitamin D labs. She said GI doctor wants to do labs for liver enzymes. Can we do those here today? She's coming at 10:45.

## 2014-08-05 NOTE — Telephone Encounter (Signed)
OK to send lft dx elevated lft.

## 2014-08-05 NOTE — Telephone Encounter (Signed)
Done

## 2014-08-06 ENCOUNTER — Telehealth: Payer: Self-pay | Admitting: *Deleted

## 2014-08-06 ENCOUNTER — Other Ambulatory Visit: Payer: Self-pay | Admitting: Family

## 2014-08-06 DIAGNOSIS — R945 Abnormal results of liver function studies: Principal | ICD-10-CM

## 2014-08-06 DIAGNOSIS — R7989 Other specified abnormal findings of blood chemistry: Secondary | ICD-10-CM

## 2014-08-06 DIAGNOSIS — E559 Vitamin D deficiency, unspecified: Secondary | ICD-10-CM

## 2014-08-06 NOTE — Telephone Encounter (Addendum)
Vitamin D is low.  Advise that she continue weekly vit D supplement and repeat vit D level in 3 months.  Also liver function testing is quite elevated (higher than last visit).  Is she having any GI complaints? I recommend that she discontinue all herbal supplements. If she is using tylenol or drinking ETOH, I recommend D/C.  I recommend that she meet with GI as well for consultation.

## 2014-08-06 NOTE — Telephone Encounter (Signed)
Please see LFT results from 08/05/14. Pt states you requested they be repeated.

## 2014-08-06 NOTE — Telephone Encounter (Signed)
Left message for pt to return my call. Per 08/05/14 phone note, pt already established with GI and they wanted recent LFTs. Forwarded results to GI for further recommendation.

## 2014-08-07 MED ORDER — VITAMIN D (ERGOCALCIFEROL) 1.25 MG (50000 UNIT) PO CAPS: 50000.0000 [IU] | ORAL_CAPSULE | ORAL | Status: DC

## 2014-08-07 NOTE — Telephone Encounter (Signed)
Notified pt and she voices understanding. Denies tylenol or ETOH use. States she has stopped all herbal supplements except those for menopausal symptoms and notes that her liver enzymes have been elevated in the past before she started herbal supplements. Pt already established with GI and will follow up with them re: LFTs.

## 2014-08-07 NOTE — Telephone Encounter (Signed)
Gunnar Fusiaula-- please see 08/05/14 LFT results.

## 2014-08-07 NOTE — Telephone Encounter (Signed)
Pt returning your call

## 2014-08-08 NOTE — Telephone Encounter (Signed)
Sheri, please call patient. Make sure she was truly off all herbs/supplements because liver enzymes are not improved and are actually worse. She may need liver biopsy, I am going to discuss case with Dr. Russella DarStark who is her primary GI. He may want to see her first but we will let her know. Thanks

## 2014-08-08 NOTE — Telephone Encounter (Signed)
No answer/machine.  I will continue to try and reach her

## 2014-08-11 NOTE — Telephone Encounter (Signed)
I spoke with the patient, she was taking Grapefruit seed extract. She was asked by her primary care MD to stop it and she did just last week. Please advise from here

## 2014-08-12 NOTE — Telephone Encounter (Signed)
Patient notified She will be contacted to come for LFTs in 2 months She is advised that she should stop all supplements/OTC meds.  She states that she has been in contact with [place] in [place] that "dealt with this before".  He instructed her to resume all of her supplements.  I urged her to listen to the advise of the specialist and not take any OTC supplements for two months and have the labs checked.   She stated she is not sure what she wants to do. New labs entered for 2 months  I will contact her and remind her to come then

## 2014-08-12 NOTE — Telephone Encounter (Signed)
Sheri, please have her stop ALL supplements, extracts and return for LFTs in a week. I still think she will need a liver biopsy but will forward this to primary GI. Thanks

## 2014-08-12 NOTE — Telephone Encounter (Signed)
Agree that she must stop all vitamins/supplements and all OTC meds that are not prescribed. Needs repeat LFTs in 1-2 months and REV shortly after that. Be sure all standard hepatic serologies have been sent.

## 2014-09-09 ENCOUNTER — Other Ambulatory Visit: Payer: Self-pay | Admitting: Family

## 2014-09-09 NOTE — Telephone Encounter (Signed)
Vitamin D rx denied. Rx was sent on 08/07/14, #12 x no refills. New request from pharmacy requests 90 day supply. Denial sent to see Rx on file from 08/07/14.

## 2014-09-26 ENCOUNTER — Telehealth: Payer: Self-pay | Admitting: Behavioral Health

## 2014-09-26 ENCOUNTER — Encounter: Payer: Self-pay | Admitting: Behavioral Health

## 2014-09-26 NOTE — Telephone Encounter (Signed)
Pre-Visit Call completed with patient and chart updated.   Pre-Visit Info documented in Specialty Comments under [employer].    

## 2014-09-29 ENCOUNTER — Ambulatory Visit (INDEPENDENT_AMBULATORY_CARE_PROVIDER_SITE_OTHER): Payer: 59 | Admitting: Family Medicine

## 2014-09-29 ENCOUNTER — Encounter: Payer: Self-pay | Admitting: Family Medicine

## 2014-09-29 ENCOUNTER — Telehealth: Payer: Self-pay | Admitting: Family Medicine

## 2014-09-29 VITALS — BP 98/60 | HR 93 | Temp 98.7°F | Resp 16 | Ht 70.5 in | Wt 192.0 lb

## 2014-09-29 DIAGNOSIS — H409 Unspecified glaucoma: Secondary | ICD-10-CM | POA: Insufficient documentation

## 2014-09-29 DIAGNOSIS — E559 Vitamin D deficiency, unspecified: Secondary | ICD-10-CM

## 2014-09-29 DIAGNOSIS — E2839 Other primary ovarian failure: Secondary | ICD-10-CM

## 2014-09-29 DIAGNOSIS — R945 Abnormal results of liver function studies: Secondary | ICD-10-CM

## 2014-09-29 DIAGNOSIS — E079 Disorder of thyroid, unspecified: Secondary | ICD-10-CM | POA: Insufficient documentation

## 2014-09-29 DIAGNOSIS — E663 Overweight: Secondary | ICD-10-CM | POA: Diagnosis not present

## 2014-09-29 DIAGNOSIS — M5442 Lumbago with sciatica, left side: Secondary | ICD-10-CM

## 2014-09-29 DIAGNOSIS — D649 Anemia, unspecified: Secondary | ICD-10-CM | POA: Insufficient documentation

## 2014-09-29 DIAGNOSIS — R7989 Other specified abnormal findings of blood chemistry: Secondary | ICD-10-CM

## 2014-09-29 DIAGNOSIS — B351 Tinea unguium: Secondary | ICD-10-CM

## 2014-09-29 DIAGNOSIS — E538 Deficiency of other specified B group vitamins: Secondary | ICD-10-CM | POA: Insufficient documentation

## 2014-09-29 DIAGNOSIS — K7689 Other specified diseases of liver: Secondary | ICD-10-CM | POA: Diagnosis not present

## 2014-09-29 DIAGNOSIS — E288 Other ovarian dysfunction: Secondary | ICD-10-CM

## 2014-09-29 DIAGNOSIS — Z0000 Encounter for general adult medical examination without abnormal findings: Secondary | ICD-10-CM

## 2014-09-29 HISTORY — DX: Overweight: E66.3

## 2014-09-29 MED ORDER — EPINEPHRINE 0.3 MG/0.3ML IJ SOAJ: 0.3000 mg | INTRAMUSCULAR | Status: DC | PRN

## 2014-09-29 NOTE — Assessment & Plan Note (Signed)
Encouraged daily supplement and check level

## 2014-09-29 NOTE — Patient Instructions (Signed)
Preventive Care for Adults A healthy lifestyle and preventive care can promote health and wellness. Preventive health guidelines for women include the following key practices.  A routine yearly physical is a good way to check with your health care provider about your health and preventive screening. It is a chance to share any concerns and updates on your health and to receive a thorough exam.  Visit your dentist for a routine exam and preventive care every 6 months. Brush your teeth twice a day and floss once a day. Good oral hygiene prevents tooth decay and gum disease.  The frequency of eye exams is based on your age, health, family medical history, use of contact lenses, and other factors. Follow your health care provider's recommendations for frequency of eye exams.  Eat a healthy diet. Foods like vegetables, fruits, whole grains, low-fat dairy products, and lean protein foods contain the nutrients you need without too many calories. Decrease your intake of foods high in solid fats, added sugars, and salt. Eat the right amount of calories for you.Get information about a proper diet from your health care provider, if necessary.  Regular physical exercise is one of the most important things you can do for your health. Most adults should get at least 150 minutes of moderate-intensity exercise (any activity that increases your heart rate and causes you to sweat) each week. In addition, most adults need muscle-strengthening exercises on 2 or more days a week.  Maintain a healthy weight. The body mass index (BMI) is a screening tool to identify possible weight problems. It provides an estimate of body fat based on height and weight. Your health care provider can find your BMI and can help you achieve or maintain a healthy weight.For adults 20 years and older:  A BMI below 18.5 is considered underweight.  A BMI of 18.5 to 24.9 is normal.  A BMI of 25 to 29.9 is considered overweight.  A BMI of  30 and above is considered obese.  Maintain normal blood lipids and cholesterol levels by exercising and minimizing your intake of saturated fat. Eat a balanced diet with plenty of fruit and vegetables. Blood tests for lipids and cholesterol should begin at age 76 and be repeated every 5 years. If your lipid or cholesterol levels are high, you are over 50, or you are at high risk for heart disease, you may need your cholesterol levels checked more frequently.Ongoing high lipid and cholesterol levels should be treated with medicines if diet and exercise are not working.  If you smoke, find out from your health care provider how to quit. If you do not use tobacco, do not start.  Lung cancer screening is recommended for adults aged 22-80 years who are at high risk for developing lung cancer because of a history of smoking. A yearly low-dose CT scan of the lungs is recommended for people who have at least a 30-pack-year history of smoking and are a current smoker or have quit within the past 15 years. A pack year of smoking is smoking an average of 1 pack of cigarettes a day for 1 year (for example: 1 pack a day for 30 years or 2 packs a day for 15 years). Yearly screening should continue until the smoker has stopped smoking for at least 15 years. Yearly screening should be stopped for people who develop a health problem that would prevent them from having lung cancer treatment.  If you are pregnant, do not drink alcohol. If you are breastfeeding,  be very cautious about drinking alcohol. If you are not pregnant and choose to drink alcohol, do not have more than 1 drink per day. One drink is considered to be 12 ounces (355 mL) of beer, 5 ounces (148 mL) of wine, or 1.5 ounces (44 mL) of liquor.  Avoid use of street drugs. Do not share needles with anyone. Ask for help if you need support or instructions about stopping the use of drugs.  High blood pressure causes heart disease and increases the risk of  stroke. Your blood pressure should be checked at least every 1 to 2 years. Ongoing high blood pressure should be treated with medicines if weight loss and exercise do not work.  If you are 3-86 years old, ask your health care provider if you should take aspirin to prevent strokes.  Diabetes screening involves taking a blood sample to check your fasting blood sugar level. This should be done once every 3 years, after age 67, if you are within normal weight and without risk factors for diabetes. Testing should be considered at a younger age or be carried out more frequently if you are overweight and have at least 1 risk factor for diabetes.  Breast cancer screening is essential preventive care for women. You should practice "breast self-awareness." This means understanding the normal appearance and feel of your breasts and may include breast self-examination. Any changes detected, no matter how small, should be reported to a health care provider. Women in their 8s and 30s should have a clinical breast exam (CBE) by a health care provider as part of a regular health exam every 1 to 3 years. After age 70, women should have a CBE every year. Starting at age 25, women should consider having a mammogram (breast X-ray test) every year. Women who have a family history of breast cancer should talk to their health care provider about genetic screening. Women at a high risk of breast cancer should talk to their health care providers about having an MRI and a mammogram every year.  Breast cancer gene (BRCA)-related cancer risk assessment is recommended for women who have family members with BRCA-related cancers. BRCA-related cancers include breast, ovarian, tubal, and peritoneal cancers. Having family members with these cancers may be associated with an increased risk for harmful changes (mutations) in the breast cancer genes BRCA1 and BRCA2. Results of the assessment will determine the need for genetic counseling and  BRCA1 and BRCA2 testing.  Routine pelvic exams to screen for cancer are no longer recommended for nonpregnant women who are considered low risk for cancer of the pelvic organs (ovaries, uterus, and vagina) and who do not have symptoms. Ask your health care provider if a screening pelvic exam is right for you.  If you have had past treatment for cervical cancer or a condition that could lead to cancer, you need Pap tests and screening for cancer for at least 20 years after your treatment. If Pap tests have been discontinued, your risk factors (such as having a new sexual partner) need to be reassessed to determine if screening should be resumed. Some women have medical problems that increase the chance of getting cervical cancer. In these cases, your health care provider may recommend more frequent screening and Pap tests.  The HPV test is an additional test that may be used for cervical cancer screening. The HPV test looks for the virus that can cause the cell changes on the cervix. The cells collected during the Pap test can be  tested for HPV. The HPV test could be used to screen women aged 30 years and older, and should be used in women of any age who have unclear Pap test results. After the age of 30, women should have HPV testing at the same frequency as a Pap test.  Colorectal cancer can be detected and often prevented. Most routine colorectal cancer screening begins at the age of 50 years and continues through age 75 years. However, your health care provider may recommend screening at an earlier age if you have risk factors for colon cancer. On a yearly basis, your health care provider may provide home test kits to check for hidden blood in the stool. Use of a small camera at the end of a tube, to directly examine the colon (sigmoidoscopy or colonoscopy), can detect the earliest forms of colorectal cancer. Talk to your health care provider about this at age 50, when routine screening begins. Direct  exam of the colon should be repeated every 5-10 years through age 75 years, unless early forms of pre-cancerous polyps or small growths are found.  People who are at an increased risk for hepatitis B should be screened for this virus. You are considered at high risk for hepatitis B if:  You were born in a country where hepatitis B occurs often. Talk with your health care provider about which countries are considered high risk.  Your parents were born in a high-risk country and you have not received a shot to protect against hepatitis B (hepatitis B vaccine).  You have HIV or AIDS.  You use needles to inject street drugs.  You live with, or have sex with, someone who has hepatitis B.  You get hemodialysis treatment.  You take certain medicines for conditions like cancer, organ transplantation, and autoimmune conditions.  Hepatitis C blood testing is recommended for all people born from 1945 through 1965 and any individual with known risks for hepatitis C.  Practice safe sex. Use condoms and avoid high-risk sexual practices to reduce the spread of sexually transmitted infections (STIs). STIs include gonorrhea, chlamydia, syphilis, trichomonas, herpes, HPV, and human immunodeficiency virus (HIV). Herpes, HIV, and HPV are viral illnesses that have no cure. They can result in disability, cancer, and death.  You should be screened for sexually transmitted illnesses (STIs) including gonorrhea and chlamydia if:  You are sexually active and are younger than 24 years.  You are older than 24 years and your health care provider tells you that you are at risk for this type of infection.  Your sexual activity has changed since you were last screened and you are at an increased risk for chlamydia or gonorrhea. Ask your health care provider if you are at risk.  If you are at risk of being infected with HIV, it is recommended that you take a prescription medicine daily to prevent HIV infection. This is  called preexposure prophylaxis (PrEP). You are considered at risk if:  You are a heterosexual woman, are sexually active, and are at increased risk for HIV infection.  You take drugs by injection.  You are sexually active with a partner who has HIV.  Talk with your health care provider about whether you are at high risk of being infected with HIV. If you choose to begin PrEP, you should first be tested for HIV. You should then be tested every 3 months for as long as you are taking PrEP.  Osteoporosis is a disease in which the bones lose minerals and strength   with aging. This can result in serious bone fractures or breaks. The risk of osteoporosis can be identified using a bone density scan. Women ages 65 years and over and women at risk for fractures or osteoporosis should discuss screening with their health care providers. Ask your health care provider whether you should take a calcium supplement or vitamin D to reduce the rate of osteoporosis.  Menopause can be associated with physical symptoms and risks. Hormone replacement therapy is available to decrease symptoms and risks. You should talk to your health care provider about whether hormone replacement therapy is right for you.  Use sunscreen. Apply sunscreen liberally and repeatedly throughout the day. You should seek shade when your shadow is shorter than you. Protect yourself by wearing long sleeves, pants, a wide-brimmed hat, and sunglasses year round, whenever you are outdoors.  Once a month, do a whole body skin exam, using a mirror to look at the skin on your back. Tell your health care provider of new moles, moles that have irregular borders, moles that are larger than a pencil eraser, or moles that have changed in shape or color.  Stay current with required vaccines (immunizations).  Influenza vaccine. All adults should be immunized every year.  Tetanus, diphtheria, and acellular pertussis (Td, Tdap) vaccine. Pregnant women should  receive 1 dose of Tdap vaccine during each pregnancy. The dose should be obtained regardless of the length of time since the last dose. Immunization is preferred during the 27th-36th week of gestation. An adult who has not previously received Tdap or who does not know her vaccine status should receive 1 dose of Tdap. This initial dose should be followed by tetanus and diphtheria toxoids (Td) booster doses every 10 years. Adults with an unknown or incomplete history of completing a 3-dose immunization series with Td-containing vaccines should begin or complete a primary immunization series including a Tdap dose. Adults should receive a Td booster every 10 years.  Varicella vaccine. An adult without evidence of immunity to varicella should receive 2 doses or a second dose if she has previously received 1 dose. Pregnant females who do not have evidence of immunity should receive the first dose after pregnancy. This first dose should be obtained before leaving the health care facility. The second dose should be obtained 4-8 weeks after the first dose.  Human papillomavirus (HPV) vaccine. Females aged 13-26 years who have not received the vaccine previously should obtain the 3-dose series. The vaccine is not recommended for use in pregnant females. However, pregnancy testing is not needed before receiving a dose. If a female is found to be pregnant after receiving a dose, no treatment is needed. In that case, the remaining doses should be delayed until after the pregnancy. Immunization is recommended for any person with an immunocompromised condition through the age of 26 years if she did not get any or all doses earlier. During the 3-dose series, the second dose should be obtained 4-8 weeks after the first dose. The third dose should be obtained 24 weeks after the first dose and 16 weeks after the second dose.  Zoster vaccine. One dose is recommended for adults aged 60 years or older unless certain conditions are  present.  Measles, mumps, and rubella (MMR) vaccine. Adults born before 1957 generally are considered immune to measles and mumps. Adults born in 1957 or later should have 1 or more doses of MMR vaccine unless there is a contraindication to the vaccine or there is laboratory evidence of immunity to   each of the three diseases. A routine second dose of MMR vaccine should be obtained at least 28 days after the first dose for students attending postsecondary schools, health care workers, or international travelers. People who received inactivated measles vaccine or an unknown type of measles vaccine during 1963-1967 should receive 2 doses of MMR vaccine. People who received inactivated mumps vaccine or an unknown type of mumps vaccine before 1979 and are at high risk for mumps infection should consider immunization with 2 doses of MMR vaccine. For females of childbearing age, rubella immunity should be determined. If there is no evidence of immunity, females who are not pregnant should be vaccinated. If there is no evidence of immunity, females who are pregnant should delay immunization until after pregnancy. Unvaccinated health care workers born before 1957 who lack laboratory evidence of measles, mumps, or rubella immunity or laboratory confirmation of disease should consider measles and mumps immunization with 2 doses of MMR vaccine or rubella immunization with 1 dose of MMR vaccine.  Pneumococcal 13-valent conjugate (PCV13) vaccine. When indicated, a person who is uncertain of her immunization history and has no record of immunization should receive the PCV13 vaccine. An adult aged 19 years or older who has certain medical conditions and has not been previously immunized should receive 1 dose of PCV13 vaccine. This PCV13 should be followed with a dose of pneumococcal polysaccharide (PPSV23) vaccine. The PPSV23 vaccine dose should be obtained at least 8 weeks after the dose of PCV13 vaccine. An adult aged 19  years or older who has certain medical conditions and previously received 1 or more doses of PPSV23 vaccine should receive 1 dose of PCV13. The PCV13 vaccine dose should be obtained 1 or more years after the last PPSV23 vaccine dose.  Pneumococcal polysaccharide (PPSV23) vaccine. When PCV13 is also indicated, PCV13 should be obtained first. All adults aged 65 years and older should be immunized. An adult younger than age 65 years who has certain medical conditions should be immunized. Any person who resides in a nursing home or long-term care facility should be immunized. An adult smoker should be immunized. People with an immunocompromised condition and certain other conditions should receive both PCV13 and PPSV23 vaccines. People with human immunodeficiency virus (HIV) infection should be immunized as soon as possible after diagnosis. Immunization during chemotherapy or radiation therapy should be avoided. Routine use of PPSV23 vaccine is not recommended for American Indians, [place] Natives, or people younger than 65 years unless there are medical conditions that require PPSV23 vaccine. When indicated, people who have unknown immunization and have no record of immunization should receive PPSV23 vaccine. One-time revaccination 5 years after the first dose of PPSV23 is recommended for people aged 19-64 years who have chronic kidney failure, nephrotic syndrome, asplenia, or immunocompromised conditions. People who received 1-2 doses of PPSV23 before age 65 years should receive another dose of PPSV23 vaccine at age 65 years or later if at least 5 years have passed since the previous dose. Doses of PPSV23 are not needed for people immunized with PPSV23 at or after age 65 years.  Meningococcal vaccine. Adults with asplenia or persistent complement component deficiencies should receive 2 doses of quadrivalent meningococcal conjugate (MenACWY-D) vaccine. The doses should be obtained at least 2 months apart.  Microbiologists working with certain meningococcal bacteria, military recruits, people at risk during an outbreak, and people who travel to or live in countries with a high rate of meningitis should be immunized. A first-year college student up through age   21 years who is living in a residence hall should receive a dose if she did not receive a dose on or after her 16th birthday. Adults who have certain high-risk conditions should receive one or more doses of vaccine.  Hepatitis A vaccine. Adults who wish to be protected from this disease, have certain high-risk conditions, work with hepatitis A-infected animals, work in hepatitis A research labs, or travel to or work in countries with a high rate of hepatitis A should be immunized. Adults who were previously unvaccinated and who anticipate close contact with an international adoptee during the first 60 days after arrival in the [place] from a country with a high rate of hepatitis A should be immunized.  Hepatitis B vaccine. Adults who wish to be protected from this disease, have certain high-risk conditions, may be exposed to blood or other infectious body fluids, are household contacts or sex partners of hepatitis B positive people, are clients or workers in certain care facilities, or travel to or work in countries with a high rate of hepatitis B should be immunized.  Haemophilus influenzae type b (Hib) vaccine. A previously unvaccinated person with asplenia or sickle cell disease or having a scheduled splenectomy should receive 1 dose of Hib vaccine. Regardless of previous immunization, a recipient of a hematopoietic stem cell transplant should receive a 3-dose series 6-12 months after her successful transplant. Hib vaccine is not recommended for adults with HIV infection. Preventive Services / Frequency Ages 64 to 68 years  Blood pressure check.** / Every 1 to 2 years.  Lipid and cholesterol check.** / Every 5 years beginning at age  22.  Clinical breast exam.** / Every 3 years for women in their 88s and 53s.  BRCA-related cancer risk assessment.** / For women who have family members with a BRCA-related cancer (breast, ovarian, tubal, or peritoneal cancers).  Pap test.** / Every 2 years from ages 90 through 51. Every 3 years starting at age 21 through age 56 or 3 with a history of 3 consecutive normal Pap tests.  HPV screening.** / Every 3 years from ages 24 through ages 1 to 46 with a history of 3 consecutive normal Pap tests.  Hepatitis C blood test.** / For any individual with known risks for hepatitis C.  Skin self-exam. / Monthly.  Influenza vaccine. / Every year.  Tetanus, diphtheria, and acellular pertussis (Tdap, Td) vaccine.** / Consult your health care provider. Pregnant women should receive 1 dose of Tdap vaccine during each pregnancy. 1 dose of Td every 10 years.  Varicella vaccine.** / Consult your health care provider. Pregnant females who do not have evidence of immunity should receive the first dose after pregnancy.  HPV vaccine. / 3 doses over 6 months, if 72 and younger. The vaccine is not recommended for use in pregnant females. However, pregnancy testing is not needed before receiving a dose.  Measles, mumps, rubella (MMR) vaccine.** / You need at least 1 dose of MMR if you were born in 1957 or later. You may also need a 2nd dose. For females of childbearing age, rubella immunity should be determined. If there is no evidence of immunity, females who are not pregnant should be vaccinated. If there is no evidence of immunity, females who are pregnant should delay immunization until after pregnancy.  Pneumococcal 13-valent conjugate (PCV13) vaccine.** / Consult your health care provider.  Pneumococcal polysaccharide (PPSV23) vaccine.** / 1 to 2 doses if you smoke cigarettes or if you have certain conditions.  Meningococcal vaccine.** /  1 dose if you are age 19 to 21 years and a first-year college  student living in a residence hall, or have one of several medical conditions, you need to get vaccinated against meningococcal disease. You may also need additional booster doses.  Hepatitis A vaccine.** / Consult your health care provider.  Hepatitis B vaccine.** / Consult your health care provider.  Haemophilus influenzae type b (Hib) vaccine.** / Consult your health care provider. Ages 40 to 64 years  Blood pressure check.** / Every 1 to 2 years.  Lipid and cholesterol check.** / Every 5 years beginning at age 20 years.  Lung cancer screening. / Every year if you are aged 55-80 years and have a 30-pack-year history of smoking and currently smoke or have quit within the past 15 years. Yearly screening is stopped once you have quit smoking for at least 15 years or develop a health problem that would prevent you from having lung cancer treatment.  Clinical breast exam.** / Every year after age 40 years.  BRCA-related cancer risk assessment.** / For women who have family members with a BRCA-related cancer (breast, ovarian, tubal, or peritoneal cancers).  Mammogram.** / Every year beginning at age 40 years and continuing for as long as you are in good health. Consult with your health care provider.  Pap test.** / Every 3 years starting at age 30 years through age 65 or 70 years with a history of 3 consecutive normal Pap tests.  HPV screening.** / Every 3 years from ages 30 years through ages 65 to 70 years with a history of 3 consecutive normal Pap tests.  Fecal occult blood test (FOBT) of stool. / Every year beginning at age 50 years and continuing until age 75 years. You may not need to do this test if you get a colonoscopy every 10 years.  Flexible sigmoidoscopy or colonoscopy.** / Every 5 years for a flexible sigmoidoscopy or every 10 years for a colonoscopy beginning at age 50 years and continuing until age 75 years.  Hepatitis C blood test.** / For all people born from 1945 through  1965 and any individual with known risks for hepatitis C.  Skin self-exam. / Monthly.  Influenza vaccine. / Every year.  Tetanus, diphtheria, and acellular pertussis (Tdap/Td) vaccine.** / Consult your health care provider. Pregnant women should receive 1 dose of Tdap vaccine during each pregnancy. 1 dose of Td every 10 years.  Varicella vaccine.** / Consult your health care provider. Pregnant females who do not have evidence of immunity should receive the first dose after pregnancy.  Zoster vaccine.** / 1 dose for adults aged 60 years or older.  Measles, mumps, rubella (MMR) vaccine.** / You need at least 1 dose of MMR if you were born in 1957 or later. You may also need a 2nd dose. For females of childbearing age, rubella immunity should be determined. If there is no evidence of immunity, females who are not pregnant should be vaccinated. If there is no evidence of immunity, females who are pregnant should delay immunization until after pregnancy.  Pneumococcal 13-valent conjugate (PCV13) vaccine.** / Consult your health care provider.  Pneumococcal polysaccharide (PPSV23) vaccine.** / 1 to 2 doses if you smoke cigarettes or if you have certain conditions.  Meningococcal vaccine.** / Consult your health care provider.  Hepatitis A vaccine.** / Consult your health care provider.  Hepatitis B vaccine.** / Consult your health care provider.  Haemophilus influenzae type b (Hib) vaccine.** / Consult your health care provider. Ages 65   years and over  Blood pressure check.** / Every 1 to 2 years.  Lipid and cholesterol check.** / Every 5 years beginning at age 22 years.  Lung cancer screening. / Every year if you are aged 73-80 years and have a 30-pack-year history of smoking and currently smoke or have quit within the past 15 years. Yearly screening is stopped once you have quit smoking for at least 15 years or develop a health problem that would prevent you from having lung cancer  treatment.  Clinical breast exam.** / Every year after age 4 years.  BRCA-related cancer risk assessment.** / For women who have family members with a BRCA-related cancer (breast, ovarian, tubal, or peritoneal cancers).  Mammogram.** / Every year beginning at age 40 years and continuing for as long as you are in good health. Consult with your health care provider.  Pap test.** / Every 3 years starting at age 9 years through age 34 or 91 years with 3 consecutive normal Pap tests. Testing can be stopped between 65 and 70 years with 3 consecutive normal Pap tests and no abnormal Pap or HPV tests in the past 10 years.  HPV screening.** / Every 3 years from ages 57 years through ages 64 or 45 years with a history of 3 consecutive normal Pap tests. Testing can be stopped between 65 and 70 years with 3 consecutive normal Pap tests and no abnormal Pap or HPV tests in the past 10 years.  Fecal occult blood test (FOBT) of stool. / Every year beginning at age 15 years and continuing until age 17 years. You may not need to do this test if you get a colonoscopy every 10 years.  Flexible sigmoidoscopy or colonoscopy.** / Every 5 years for a flexible sigmoidoscopy or every 10 years for a colonoscopy beginning at age 86 years and continuing until age 71 years.  Hepatitis C blood test.** / For all people born from [birth year] through 1965 and any individual with known risks for hepatitis C.  Osteoporosis screening.** / A one-time screening for women ages 83 years and over and women at risk for fractures or osteoporosis.  Skin self-exam. / Monthly.  Influenza vaccine. / Every year.  Tetanus, diphtheria, and acellular pertussis (Tdap/Td) vaccine.** / 1 dose of Td every 10 years.  Varicella vaccine.** / Consult your health care provider.  Zoster vaccine.** / 1 dose for adults aged 61 years or older.  Pneumococcal 13-valent conjugate (PCV13) vaccine.** / Consult your health care provider.  Pneumococcal  polysaccharide (PPSV23) vaccine.** / 1 dose for all adults aged 28 years and older.  Meningococcal vaccine.** / Consult your health care provider.  Hepatitis A vaccine.** / Consult your health care provider.  Hepatitis B vaccine.** / Consult your health care provider.  Haemophilus influenzae type b (Hib) vaccine.** / Consult your health care provider. ** Family history and personal history of risk and conditions may change your health care provider's recommendations. Document Released: 04/19/2001 Document Revised: 07/08/2013 Document Reviewed: 07/19/2010 [facility] Patient Information 2015 [place], [place]. This information is not intended to replace advice given to you by your health care provider. Make sure you discuss any questions you have with your health care provider.

## 2014-09-29 NOTE — Progress Notes (Signed)
Pre visit review using our clinic review tool, if applicable. No additional management support is needed unless otherwise documented below in the visit note. 

## 2014-09-29 NOTE — Progress Notes (Signed)
Hailey Cochran  [id number] 11/18/1960 09/29/2014      Progress Note-Follow Up  Subjective  Chief Complaint  Chief Complaint  Patient presents with  . Establish Care    HPI  Patient is a 54 y.o. female in today for routine medical care. IN today to establish care, is struggling with sadness over the recent death of her mother. She is now helping her 100 year old grandmother with dementia as well. No significant anhedonia despite the stress. She also struggles with frequent hot flashes, elevated liver functions, chronic back pain and more. Has stopped all herbal meds due to elevated liver functions and they are improving. Denies CP/palp/SOB/HA/congestion/fevers/GI or GU c/o. Taking meds as prescribed  Past Medical History  Diagnosis Date  . Fibromyalgia   . Lumbar herniated disc   . History of endometriosis   . Elevated liver enzymes   . Endometriosis   . Glaucoma   . Anemia   . Vitamin B 12 deficiency   . 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 Elevated antinuclear antibody (ANA) level   . Premature ovarian failure     30s  . Overweight 09/29/2014    Past Surgical History  Procedure Laterality Date  . Salpingoophorectomy Right 1990    endometriosis    Family History  Problem Relation Age of Onset  . Dementia Mother     "lewey body"  . Leukemia Mother   . Diabetes Mother     diet controlled  . Hypertension Mother     diet controlled  . Hepatitis C Father      2 strains  . Cancer Father     hepatocellular carcinoma  . Hypertension Father   . Bullous pemphigoid Father   . Alcohol abuse Father     drug abuse  . Muscular dystrophy Paternal Uncle   . Other Sister     fatty liver disease  . Rickets Son   . Gallstones Maternal Aunt   . Anemia Maternal Aunt   . Diabetes Maternal Grandmother   . Gallstones Maternal Grandfather   . Heart disease Maternal Grandfather     cardiomegaly  . Alzheimer's disease Paternal Grandfather   .  Hypertension Paternal Grandfather   . Single kidney Daughter   . Eczema Son     History   Social History  . Marital Status: Married    Spouse Name: N/A  . Number of Children: 8  . 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 Smoking status: Never Smoker   . Smokeless tobacco: Never Used  . Alcohol Use: No  . Drug Use: No  . Sexual Activity: Yes     Comment: lives with children, husband, sister in law and GM, no dietary restrictions avoids pork and alcohol.   Other Topics Concern  . Not on file   Social History Narrative   2 biological children 36- daughter [name]                                    21- [name]   3 miscarraiges       Adopted children:   23 year old- [name]   11 year old amelia   54 year old evan michael   7 yosiah   6 jeshua   6 kaleb daniel  Married- Remi Deter   Homeschools her children   Enjoys Psychologist, forensic, Air traffic controller, dancing   Christian, jewish heritage    Current Outpatient Prescriptions on File Prior to Visit  Medication Sig Dispense Refill  . Vitamin D, Ergocalciferol, (DRISDOL) 50000 UNITS CAPS capsule Take 1 capsule (50,000 Units total) by mouth every 7 (seven) days. 12 capsule 0   No current facility-administered medications on file prior to visit.    Allergies  Allergen Reactions  . Morphine   . Other     ANTS.  Causes numbness / paralysis per pt  . Wasp Venom Other (See Comments)    Numbness / paralysis    Review of Systems  Review of Systems  Constitutional: Negative for fever, chills and malaise/fatigue.  HENT: Negative for congestion, hearing loss and nosebleeds.   Eyes: Negative for discharge.  Respiratory: Negative for cough, sputum production, shortness of breath and wheezing.   Cardiovascular: Negative for chest pain, palpitations and leg swelling.  Gastrointestinal: Negative for heartburn, nausea, vomiting, abdominal pain, diarrhea, constipation and  blood in stool.  Genitourinary: Negative for dysuria, urgency, frequency and hematuria.  Musculoskeletal: Negative for myalgias, back pain and falls.  Skin: Negative for rash.  Neurological: Negative for dizziness, tremors, sensory change, focal weakness, loss of consciousness, weakness and headaches.  Endo/Heme/Allergies: Negative for polydipsia. Does not bruise/bleed easily.  Psychiatric/Behavioral: Negative for depression and suicidal ideas. The patient is not nervous/anxious and does not have insomnia.     Objective  BP 98/60 mmHg  Pulse 93  Temp(Src) 98.7 F (37.1 C) (Oral)  Resp 16  Ht 5' 10.5" (1.791 m)  Wt 192 lb (87.091 kg)  BMI 27.15 kg/m2  SpO2 97%  Physical Exam  Physical Exam  Constitutional: She is oriented to person, place, and time and well-developed, well-nourished, and in no distress. No distress.  HENT:  Head: Normocephalic and atraumatic.  Right Ear: External ear normal.  Left Ear: External ear normal.  Nose: Nose normal.  Mouth/Throat: Oropharynx is clear and moist. No oropharyngeal exudate.  Eyes: Conjunctivae are normal. Pupils are equal, round, and reactive to light. Right eye exhibits no discharge. Left eye exhibits no discharge. No scleral icterus.  Neck: Normal range of motion. Neck supple. No thyromegaly present.  Cardiovascular: Normal rate, regular rhythm, normal heart sounds and intact distal pulses.   No murmur heard. Pulmonary/Chest: Effort normal and breath sounds normal. No respiratory distress. She has no wheezes. She has no rales.  Abdominal: Soft. Bowel sounds are normal. She exhibits no distension and no mass. There is no tenderness.  Musculoskeletal: Normal range of motion. She exhibits no edema or tenderness.  Lymphadenopathy:    She has no cervical adenopathy.  Neurological: She is alert and oriented to person, place, and time. She has normal reflexes. No cranial nerve deficit. Coordination normal.  Skin: Skin is warm and dry. No rash  noted. She is not diaphoretic.  Psychiatric: Mood, memory and affect normal.    No results found for: TSH No results found for: WBC, HGB, HCT, MCV, PLT No results found for: CREATININE, BUN, NA, K, CL, CO2 Lab Results  Component Value Date   ALT 135* 08/05/2014   AST 96* 08/05/2014   ALKPHOS 163* 08/05/2014   BILITOT 0.4 08/05/2014   No results found for: CHOL No results found for: HDL No results found for: LDLCALC No results found for: TRIG No results found for: CHOLHDL   Assessment & Plan  Overweight Encouraged DASH diet, decrease po intake and increase exercise  as tolerated. Needs 7-8 hours of sleep nightly. Avoid trans fats, eat small, frequent meals every 4-5 hours with lean proteins, complex carbs and healthy fats. Minimize simple carbs  Vitamin D deficiency Encouraged daily supplement and check level  Onychomycosis Encouraged to soak feet in hot water and distilled white vinegar with Listerine for 15 minutes each night, then apply Vick's Vapor Rub, lamisil and or Lavendar oil twice daily  Abnormal liver function test Recheck levels, improved with dropping some herbal preparations. Will recheck levels today and refer to gastroenterology for further consideration  Low back pain with left-sided sciatica Try Curcumen, moist heat. Stay active.  Anemia Increase leafy greens, consider increased lean red meat and using cast iron cookware. Continue to monitor, report any concerns

## 2014-09-29 NOTE — Assessment & Plan Note (Signed)
Try Curcumen, moist heat. Stay active.

## 2014-09-29 NOTE — Assessment & Plan Note (Signed)
Encouraged to soak feet in hot water and distilled white vinegar with Listerine for 15 minutes each night, then apply Vick's Vapor Rub, lamisil and or Lavendar oil twice daily

## 2014-09-29 NOTE — Assessment & Plan Note (Signed)
Recheck levels, improved with dropping some herbal preparations. Will recheck levels today and refer to gastroenterology for further consideration

## 2014-09-29 NOTE — Assessment & Plan Note (Signed)
Encouraged DASH diet, decrease po intake and increase exercise as tolerated. Needs 7-8 hours of sleep nightly. Avoid trans fats, eat small, frequent meals every 4-5 hours with lean proteins, complex carbs and healthy fats. Minimize simple carbs 

## 2014-09-29 NOTE — Assessment & Plan Note (Signed)
Increase leafy greens, consider increased lean red meat and using cast iron cookware. Continue to monitor, report any concerns 

## 2014-09-29 NOTE — Telephone Encounter (Signed)
Pt was in for new pt appt and Dr. Abner Greenspan wanted to see her for cpe and gyn in 4 months. Next avail in 6 months. Pt scheduled for 04/05/14. She would prefer to come in November if there is a day that you can see her. Please advise.

## 2014-09-30 LAB — CBC
HEMATOCRIT: 37.8 % (ref 36.0–46.0)
Hemoglobin: 12.7 g/dL (ref 12.0–15.0)
MCHC: 33.6 g/dL (ref 30.0–36.0)
MCV: 93.5 fl (ref 78.0–100.0)
Platelets: 271 10*3/uL (ref 150.0–400.0)
RBC: 4.04 Mil/uL (ref 3.87–5.11)
RDW: 12.9 % (ref 11.5–15.5)
WBC: 10.3 10*3/uL (ref 4.0–10.5)

## 2014-09-30 LAB — LIPID PANEL
CHOLESTEROL: 201 mg/dL — AB (ref 0–200)
HDL: 48 mg/dL (ref >39.00)
LDL Cholesterol: 128 mg/dL — ABNORMAL HIGH (ref 0–99)
NONHDL: 153
Total CHOL/HDL Ratio: 4
Triglycerides: 125 mg/dL (ref 0.0–149.0)
VLDL: 25 mg/dL (ref 0.0–40.0)

## 2014-09-30 LAB — COMPREHENSIVE METABOLIC PANEL
ALBUMIN: 3.9 g/dL (ref 3.5–5.2)
ALT: 44 U/L — ABNORMAL HIGH (ref 0–35)
AST: 28 U/L (ref 0–37)
Alkaline Phosphatase: 96 U/L (ref 39–117)
BUN: 15 mg/dL (ref 6–23)
CALCIUM: 9 mg/dL (ref 8.4–10.5)
CO2: 24 mEq/L (ref 19–32)
Chloride: 108 mEq/L (ref 96–112)
Creatinine, Ser: 0.8 mg/dL (ref 0.40–1.20)
GFR: 79.34 mL/min (ref >60.00)
Glucose, Bld: 107 mg/dL — ABNORMAL HIGH (ref 70–99)
POTASSIUM: 3.6 meq/L (ref 3.5–5.1)
SODIUM: 140 meq/L (ref 135–145)
TOTAL PROTEIN: 7.3 g/dL (ref 6.0–8.3)
Total Bilirubin: 0.2 mg/dL (ref 0.2–1.2)

## 2014-09-30 LAB — VITAMIN D 25 HYDROXY (VIT D DEFICIENCY, FRACTURES): VITD: 32.32 ng/mL (ref 30.00–100.00)

## 2014-09-30 LAB — TSH: TSH: 2.67 u[IU]/mL (ref 0.35–4.50)

## 2014-10-01 ENCOUNTER — Telehealth: Payer: Self-pay

## 2014-10-01 NOTE — Telephone Encounter (Signed)
Scheduled with Dr. Abner Greenspan 12/08/14.

## 2014-10-01 NOTE — Telephone Encounter (Signed)
-----   Message from Bradd Canary, MD sent at 09/30/2014  9:51 PM EDT ----- Notify labs look good. Liver functions look much better.if she decides to add back some supplements. She should only add one at a time and then we should check hepatic panel 3-4 weeks after starting each one.cholesterol off slightly. Maintain heart healthy diet such as DASH diet, increase exercise, avoid trans fats, consider a krill oil cap daily

## 2014-10-01 NOTE — Telephone Encounter (Signed)
Pt saw results on MyChart no questions or concerns at this time.

## 2014-10-30 ENCOUNTER — Other Ambulatory Visit: Payer: 59

## 2014-10-31 ENCOUNTER — Encounter: Payer: Self-pay | Admitting: Family Medicine

## 2014-10-31 ENCOUNTER — Other Ambulatory Visit: Payer: 59

## 2014-10-31 ENCOUNTER — Ambulatory Visit (INDEPENDENT_AMBULATORY_CARE_PROVIDER_SITE_OTHER): Payer: 59 | Admitting: Family Medicine

## 2014-10-31 VITALS — BP 120/76 | HR 89 | Temp 97.7°F | Ht 71.0 in | Wt 193.1 lb

## 2014-10-31 DIAGNOSIS — E559 Vitamin D deficiency, unspecified: Secondary | ICD-10-CM | POA: Diagnosis not present

## 2014-10-31 DIAGNOSIS — E288 Other ovarian dysfunction: Secondary | ICD-10-CM | POA: Diagnosis not present

## 2014-10-31 DIAGNOSIS — R52 Pain, unspecified: Secondary | ICD-10-CM

## 2014-10-31 DIAGNOSIS — R945 Abnormal results of liver function studies: Secondary | ICD-10-CM

## 2014-10-31 DIAGNOSIS — D649 Anemia, unspecified: Secondary | ICD-10-CM

## 2014-10-31 DIAGNOSIS — E538 Deficiency of other specified B group vitamins: Secondary | ICD-10-CM

## 2014-10-31 DIAGNOSIS — E2839 Other primary ovarian failure: Secondary | ICD-10-CM

## 2014-10-31 DIAGNOSIS — R7989 Other specified abnormal findings of blood chemistry: Secondary | ICD-10-CM

## 2014-10-31 DIAGNOSIS — M797 Fibromyalgia: Secondary | ICD-10-CM

## 2014-10-31 LAB — HEPATIC FUNCTION PANEL
ALK PHOS: 97 U/L (ref 39–117)
ALT: 27 U/L (ref 0–35)
AST: 22 U/L (ref 0–37)
Albumin: 4.2 g/dL (ref 3.5–5.2)
Bilirubin, Direct: 0.1 mg/dL (ref 0.0–0.3)
Total Bilirubin: 0.3 mg/dL (ref 0.2–1.2)
Total Protein: 7.8 g/dL (ref 6.0–8.3)

## 2014-10-31 LAB — SEDIMENTATION RATE: SED RATE: 44 mm/h — AB (ref 0–22)

## 2014-10-31 LAB — RHEUMATOID FACTOR

## 2014-10-31 LAB — C-REACTIVE PROTEIN: CRP: 1.7 mg/dL (ref 0.5–20.0)

## 2014-10-31 LAB — VITAMIN B12: Vitamin B-12: 681 pg/mL (ref 211–911)

## 2014-10-31 NOTE — Progress Notes (Signed)
Pre visit review using our clinic review tool, if applicable. No additional management support is needed unless otherwise documented below in the visit note. 

## 2014-10-31 NOTE — Assessment & Plan Note (Signed)
Hot flashes were controlled with herbals but had to stop them due to elevated liver functions

## 2014-10-31 NOTE — Assessment & Plan Note (Signed)
Pain flared recently for no obvious reason. No recent illness.

## 2014-10-31 NOTE — Patient Instructions (Addendum)
Possible St. John's Wort in a couple months if liver functions are still high.   Chronic Pain Chronic pain can be defined as pain that is off and on and lasts for 3-6 months or longer. Many things cause chronic pain, which can make it difficult to make a diagnosis. There are many treatment options available for chronic pain. However, finding a treatment that works well for you may require trying various approaches until the right one is found. Many people benefit from a combination of two or more types of treatment to control their pain. SYMPTOMS  Chronic pain can occur anywhere in the body and can range from mild to very severe. Some types of chronic pain include:  Headache.  Low back pain.  Cancer pain.  Arthritis pain.  Neurogenic pain. This is pain resulting from damage to nerves. People with chronic pain may also have other symptoms such as:  Depression.  Anger.  Insomnia.  Anxiety. DIAGNOSIS  Your health care provider will help diagnose your condition over time. In many cases, the initial focus will be on excluding possible conditions that could be causing the pain. Depending on your symptoms, your health care provider may order tests to diagnose your condition. Some of these tests may include:   Blood tests.   CT scan.   MRI.   X-rays.   Ultrasounds.   Nerve conduction studies.  You may need to see a specialist.  TREATMENT  Finding treatment that works well may take time. You may be referred to a pain specialist. He or she may prescribe medicine or therapies, such as:   Mindful meditation or yoga.  Shots (injections) of numbing or pain-relieving medicines into the spine or area of pain.  Local electrical stimulation.  Acupuncture.   Massage therapy.   Aroma, color, light, or sound therapy.   Biofeedback.   Working with a physical therapist to keep from getting stiff.   Regular, gentle exercise.   Cognitive or behavioral therapy.    Group support.  Sometimes, surgery may be recommended.  HOME CARE INSTRUCTIONS   Take all medicines as directed by your health care provider.   Lessen stress in your life by relaxing and doing things such as listening to calming music.   Exercise or be active as directed by your health care provider.   Eat a healthy diet and include things such as vegetables, fruits, fish, and lean meats in your diet.   Keep all follow-up appointments with your health care provider.   Attend a support group with others suffering from chronic pain. SEEK MEDICAL CARE IF:   Your pain gets worse.   You develop a new pain that was not there before.   You cannot tolerate medicines given to you by your health care provider.   You have new symptoms since your last visit with your health care provider.  SEEK IMMEDIATE MEDICAL CARE IF:   You feel weak.   You have decreased sensation or numbness.   You lose control of bowel or bladder function.   Your pain suddenly gets much worse.   You develop shaking.  You develop chills.  You develop confusion.  You develop chest pain.  You develop shortness of breath.  MAKE SURE YOU:  Understand these instructions.  Will watch your condition.  Will get help right away if you are not doing well or get worse. Document Released: 11/13/2001 Document Revised: 10/24/2012 Document Reviewed: 08/17/2012 [facility] Patient Information 2015 [place], [place]. This information is not intended  to replace advice given to you by your health care provider. Make sure you discuss any questions you have with your health care provider.  

## 2014-10-31 NOTE — Assessment & Plan Note (Signed)
Resolved on last check. Increase leafy greens, consider increased lean red meat and using cast iron cookware. Continue to monitor, report any concerns

## 2014-10-31 NOTE — Assessment & Plan Note (Signed)
Encouraged DASH diet, decrease po intake and increase exercise as tolerated. Needs 7-8 hours of sleep nightly. Avoid trans fats, eat small, frequent meals every 4-5 hours with lean proteins, complex carbs and healthy fats. Minimize simple carbs, GMO foods. 

## 2014-10-31 NOTE — Progress Notes (Signed)
Subjective:    Patient ID: Hailey Cochran, female    DOB: 1960/06/14, 54 y.o.   MRN: [id number]  Chief Complaint  Patient presents with  . Pain    HPI Patient is in today for evaluation of chronic pain. She notes pain is persistent and recently flared. Has stopped all herbal supplements and notes they must have been helping because her myalgias and arthralgias have worsened. No recent illness but she relates her trboule with chronic pain dates back to 53 when she was diagnosed with EBV. Denies CP/palp/SOB/HA/congestion/fevers/GI or GU c/o. Taking meds as prescribed  Past Medical History  Diagnosis Date  . Fibromyalgia   . Lumbar herniated disc   . History of endometriosis   . Elevated liver enzymes   . Endometriosis   . Glaucoma   . Anemia   . Vitamin B 12 deficiency   . 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 Elevated antinuclear antibody (ANA) level   . Premature ovarian failure     30s  . Overweight 09/29/2014    Past Surgical History  Procedure Laterality Date  . Salpingoophorectomy Right 1990    endometriosis    Family History  Problem Relation Age of Onset  . Dementia Mother     "lewey body"  . Leukemia Mother   . Diabetes Mother     diet controlled  . Hypertension Mother     diet controlled  . Hepatitis C Father      2 strains  . Cancer Father     hepatocellular carcinoma  . Hypertension Father   . Bullous pemphigoid Father   . Alcohol abuse Father     drug abuse  . Muscular dystrophy Paternal Uncle   . Other Sister     fatty liver disease  . Rickets Son   . Gallstones Maternal Aunt   . Anemia Maternal Aunt   . Diabetes Maternal Grandmother   . Gallstones Maternal Grandfather   . Heart disease Maternal Grandfather     cardiomegaly  . Alzheimer's disease Paternal Grandfather   . Hypertension Paternal Grandfather   . Single kidney Daughter   . Eczema Son     Social History   Social History  . Marital Status: Married      Spouse Name: N/A  . Number of Children: 8  . 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 Smoking status: Never Smoker   . Smokeless tobacco: Never Used  . Alcohol Use: No  . Drug Use: No  . Sexual Activity: Yes     Comment: lives with children, husband, sister in law and GM, no dietary restrictions avoids pork and alcohol.   Other Topics Concern  . Not on file   Social History Narrative   2 biological children 40- daughter Hailey Cochran                                    82- Hailey Cochran   3 miscarraiges       Adopted children:   76 year old- Hailey Cochran   54 year old Hailey Cochran   54 year old Hailey Cochran   7 yosiah   6 jeshua   6 kaleb daniel      Married- Hailey Cochran   Homeschools her children   Enjoys Psychologist, forensic, Air traffic controller, dancing   [place], jewish  heritage    Outpatient Prescriptions Prior to Visit  Medication Sig Dispense Refill  . EPINEPHrine 0.3 mg/0.3 mL IJ SOAJ injection Inject 0.3 mLs (0.3 mg total) into the muscle as needed. 2 Device 1  . Vitamin D, Ergocalciferol, (DRISDOL) 50000 UNITS CAPS capsule Take 1 capsule (50,000 Units total) by mouth every 7 (seven) days. 12 capsule 0   No facility-administered medications prior to visit.    Allergies  Allergen Reactions  . Morphine   . Other     ANTS.  Causes numbness / paralysis per pt  . Wasp Venom Other (See Comments)    Numbness / paralysis    Review of Systems  Constitutional: Positive for malaise/fatigue. Negative for fever.  HENT: Negative for congestion.   Eyes: Negative for discharge.  Respiratory: Negative for shortness of breath.   Cardiovascular: Negative for chest pain, palpitations and leg swelling.  Gastrointestinal: Negative for nausea and abdominal pain.  Genitourinary: Negative for dysuria.  Musculoskeletal: Positive for myalgias and back pain. Negative for falls.  Skin: Negative for rash.  Neurological: Negative for loss of  consciousness and headaches.  Endo/Heme/Allergies: Negative for environmental allergies.  Psychiatric/Behavioral: Negative for depression. The patient is not nervous/anxious.        Objective:    Physical Exam  Constitutional: She is oriented to person, place, and time. She appears well-developed and well-nourished. No distress.  HENT:  Head: Normocephalic and atraumatic.  Nose: Nose normal.  Eyes: Right eye exhibits no discharge. Left eye exhibits no discharge.  Neck: Normal range of motion. Neck supple.  Cardiovascular: Normal rate and regular rhythm.   No murmur heard. Pulmonary/Chest: Effort normal and breath sounds normal.  Abdominal: Soft. Bowel sounds are normal. There is no tenderness.  Musculoskeletal: She exhibits no edema.  Neurological: She is alert and oriented to person, place, and time.  Skin: Skin is warm and dry.  Psychiatric: She has a normal mood and affect.  Nursing note and vitals reviewed.   BP 120/76 mmHg  Pulse 89  Temp(Src) 97.7 F (36.5 C) (Oral)  Ht 5\' 11"  (1.803 m)  Wt 193 lb 2 oz (87.601 kg)  BMI 26.95 kg/m2  SpO2 97% Wt Readings from Last 3 Encounters:  10/31/14 193 lb 2 oz (87.601 kg)  09/29/14 192 lb (87.091 kg)  05/05/14 180 lb (81.647 kg)     Lab Results  Component Value Date   WBC 10.3 09/29/2014   HGB 12.7 09/29/2014   HCT 37.8 09/29/2014   PLT 271.0 09/29/2014   GLUCOSE 107* 09/29/2014   CHOL 201* 09/29/2014   TRIG 125.0 09/29/2014   HDL 48.00 09/29/2014   LDLCALC 128* 09/29/2014   ALT 44* 09/29/2014   AST 28 09/29/2014   NA 140 09/29/2014   K 3.6 09/29/2014   CL 108 09/29/2014   CREATININE 0.80 09/29/2014   BUN 15 09/29/2014   CO2 24 09/29/2014   TSH 2.67 09/29/2014    Lab Results  Component Value Date   TSH 2.67 09/29/2014   Lab Results  Component Value Date   WBC 10.3 09/29/2014   HGB 12.7 09/29/2014   HCT 37.8 09/29/2014   MCV 93.5 09/29/2014   PLT 271.0 09/29/2014   Lab Results  Component Value Date     NA 140 09/29/2014   K 3.6 09/29/2014   CO2 24 09/29/2014   GLUCOSE 107* 09/29/2014   BUN 15 09/29/2014   CREATININE 0.80 09/29/2014   BILITOT 0.2 09/29/2014   ALKPHOS 96 09/29/2014   AST  28 09/29/2014   ALT 44* 09/29/2014   PROT 7.3 09/29/2014   ALBUMIN 3.9 09/29/2014   CALCIUM 9.0 09/29/2014   GFR 79.34 09/29/2014   Lab Results  Component Value Date   CHOL 201* 09/29/2014   Lab Results  Component Value Date   HDL 48.00 09/29/2014   Lab Results  Component Value Date   LDLCALC 128* 09/29/2014   Lab Results  Component Value Date   TRIG 125.0 09/29/2014   Lab Results  Component Value Date   CHOLHDL 4 09/29/2014   No results found for: HGBA1C     Assessment & Plan:  Abnormal liver function test Improved with stopping herbal meds recheck today  Vitamin D deficiency Normal with blood draw in July but low normal. Should continue supplements   Premature ovarian failure Hot flashes were controlled with herbals but had to stop them due to elevated liver functions  Anemia Resolved on last check. Increase leafy greens, consider increased lean red meat and using cast iron cookware. Continue to monitor, report any concerns  Overweight Encouraged DASH diet, decrease po intake and increase exercise as tolerated. Needs 7-8 hours of sleep nightly. Avoid trans fats, eat small, frequent meals every 4-5 hours with lean proteins, complex carbs and healthy fats. Minimize simple carbs, GMO foods.  Fibromyalgia Pain flared recently for no obvious reason. No recent illness.    I am having Ms. Kniseley maintain her Vitamin D (Ergocalciferol) and EPINEPHrine.  No orders of the defined types were placed in this encounter.     Danise Edge, MD

## 2014-10-31 NOTE — Assessment & Plan Note (Signed)
Improved with stopping herbal meds recheck today

## 2014-10-31 NOTE — Assessment & Plan Note (Signed)
Normal with blood draw in July but low normal. Should continue supplements

## 2014-11-14 ENCOUNTER — Other Ambulatory Visit: Payer: Self-pay | Admitting: Family Medicine

## 2014-11-14 MED ORDER — VITAMIN D (ERGOCALCIFEROL) 1.25 MG (50000 UNIT) PO CAPS: 50000.0000 [IU] | ORAL_CAPSULE | ORAL | Status: DC

## 2014-11-28 ENCOUNTER — Other Ambulatory Visit (INDEPENDENT_AMBULATORY_CARE_PROVIDER_SITE_OTHER): Payer: 59

## 2014-11-28 DIAGNOSIS — R7989 Other specified abnormal findings of blood chemistry: Secondary | ICD-10-CM

## 2014-11-28 DIAGNOSIS — R945 Abnormal results of liver function studies: Secondary | ICD-10-CM

## 2014-11-28 LAB — HEPATIC FUNCTION PANEL
ALBUMIN: 4 g/dL (ref 3.5–5.2)
ALT: 40 U/L — ABNORMAL HIGH (ref 0–35)
AST: 29 U/L (ref 0–37)
Alkaline Phosphatase: 102 U/L (ref 39–117)
BILIRUBIN DIRECT: 0.1 mg/dL (ref 0.0–0.3)
Total Bilirubin: 0.3 mg/dL (ref 0.2–1.2)
Total Protein: 7.3 g/dL (ref 6.0–8.3)

## 2014-12-04 ENCOUNTER — Telehealth: Payer: Self-pay | Admitting: Family Medicine

## 2014-12-04 NOTE — Telephone Encounter (Signed)
How about Dec 6 at 8:30?

## 2014-12-04 NOTE — Telephone Encounter (Signed)
Relation to ZO:XWRU Call back number: Pharmacy:  Reason for call:  Patient had to [facility] pap/gyn appointment for Monday 12/08/2014 at 3:45 PM (looks as if patient was squeezed in), patient states parent had surgery and has to take care of parent. The next available appointment is not until February patient was placed on wait list and would like to know if PCP could see her sooner for pap/gyn. Please advise

## 2014-12-08 ENCOUNTER — Ambulatory Visit: Payer: 59 | Admitting: Family Medicine

## 2014-12-08 NOTE — Telephone Encounter (Signed)
Left VM regarding date below

## 2015-01-05 ENCOUNTER — Telehealth: Payer: Self-pay | Admitting: Family Medicine

## 2015-01-05 DIAGNOSIS — R945 Abnormal results of liver function studies: Secondary | ICD-10-CM

## 2015-01-05 NOTE — Telephone Encounter (Signed)
Caller name: Dahlia ClientHannah Relation to pt: Self Call back number:[phone] Pharmacy:  Reason for call: Pt states needs a lab order to be put in for her liver function for this month to be verified. Please advise.

## 2015-01-05 NOTE — Telephone Encounter (Signed)
Please order hepatic panel for abnormal liver function tests.

## 2015-01-06 NOTE — Telephone Encounter (Signed)
Lab ordered and patient informed done

## 2015-01-28 ENCOUNTER — Telehealth: Payer: Self-pay | Admitting: Behavioral Health

## 2015-01-28 NOTE — Telephone Encounter (Signed)
Unable to reach patient at time of Pre-Visit Call.  Left message for patient to return call when available.    

## 2015-02-02 ENCOUNTER — Encounter: Payer: Self-pay | Admitting: Family Medicine

## 2015-02-02 ENCOUNTER — Ambulatory Visit (INDEPENDENT_AMBULATORY_CARE_PROVIDER_SITE_OTHER): Payer: 59 | Admitting: Family Medicine

## 2015-02-02 ENCOUNTER — Ambulatory Visit: Payer: 59 | Admitting: Family Medicine

## 2015-02-02 ENCOUNTER — Other Ambulatory Visit (HOSPITAL_COMMUNITY)
Admission: RE | Admit: 2015-02-02 | Discharge: 2015-02-02 | Disposition: A | Discharge status: Home / Self Care | Payer: 59 | Source: Ambulatory Visit | Attending: Family Medicine | Admitting: Family Medicine

## 2015-02-02 VITALS — BP 98/68 | HR 82 | Temp 98.1°F | Ht 71.0 in | Wt 194.0 lb

## 2015-02-02 DIAGNOSIS — M5442 Lumbago with sciatica, left side: Secondary | ICD-10-CM

## 2015-02-02 DIAGNOSIS — D649 Anemia, unspecified: Secondary | ICD-10-CM | POA: Diagnosis not present

## 2015-02-02 DIAGNOSIS — E559 Vitamin D deficiency, unspecified: Secondary | ICD-10-CM | POA: Diagnosis not present

## 2015-02-02 DIAGNOSIS — R945 Abnormal results of liver function studies: Secondary | ICD-10-CM

## 2015-02-02 DIAGNOSIS — R700 Elevated erythrocyte sedimentation rate: Secondary | ICD-10-CM | POA: Diagnosis not present

## 2015-02-02 DIAGNOSIS — M79644 Pain in right finger(s): Secondary | ICD-10-CM | POA: Diagnosis not present

## 2015-02-02 DIAGNOSIS — Z124 Encounter for screening for malignant neoplasm of cervix: Secondary | ICD-10-CM | POA: Insufficient documentation

## 2015-02-02 DIAGNOSIS — E663 Overweight: Secondary | ICD-10-CM

## 2015-02-02 DIAGNOSIS — E538 Deficiency of other specified B group vitamins: Secondary | ICD-10-CM

## 2015-02-02 DIAGNOSIS — R5383 Other fatigue: Secondary | ICD-10-CM

## 2015-02-02 DIAGNOSIS — R7989 Other specified abnormal findings of blood chemistry: Secondary | ICD-10-CM

## 2015-02-02 DIAGNOSIS — Z01419 Encounter for gynecological examination (general) (routine) without abnormal findings: Secondary | ICD-10-CM | POA: Insufficient documentation

## 2015-02-02 DIAGNOSIS — Z0000 Encounter for general adult medical examination without abnormal findings: Secondary | ICD-10-CM

## 2015-02-02 HISTORY — DX: Encounter for screening for malignant neoplasm of cervix: Z12.4

## 2015-02-02 LAB — CBC
HEMATOCRIT: 41 % (ref 36.0–46.0)
Hemoglobin: 13.5 g/dL (ref 12.0–15.0)
MCHC: 33 g/dL (ref 30.0–36.0)
MCV: 94.8 fl (ref 78.0–100.0)
Platelets: 277 10*3/uL (ref 150.0–400.0)
RBC: 4.33 Mil/uL (ref 3.87–5.11)
RDW: 12.9 % (ref 11.5–15.5)
WBC: 8.1 10*3/uL (ref 4.0–10.5)

## 2015-02-02 LAB — COMPREHENSIVE METABOLIC PANEL
ALK PHOS: 175 U/L — AB (ref 39–117)
ALT: 131 U/L — ABNORMAL HIGH (ref 0–35)
AST: 87 U/L — ABNORMAL HIGH (ref 0–37)
Albumin: 3.9 g/dL (ref 3.5–5.2)
BUN: 13 mg/dL (ref 6–23)
CO2: 28 meq/L (ref 19–32)
Calcium: 9.2 mg/dL (ref 8.4–10.5)
Chloride: 104 mEq/L (ref 96–112)
Creatinine, Ser: 0.69 mg/dL (ref 0.40–1.20)
GFR: 93.98 mL/min (ref >60.00)
GLUCOSE: 105 mg/dL — AB (ref 70–99)
POTASSIUM: 4 meq/L (ref 3.5–5.1)
SODIUM: 140 meq/L (ref 135–145)
TOTAL PROTEIN: 7.4 g/dL (ref 6.0–8.3)
Total Bilirubin: 0.3 mg/dL (ref 0.2–1.2)

## 2015-02-02 LAB — TSH: TSH: 4.02 u[IU]/mL (ref 0.35–4.50)

## 2015-02-02 LAB — LIPID PANEL
CHOL/HDL RATIO: 4
Cholesterol: 189 mg/dL (ref 0–200)
HDL: 49.9 mg/dL (ref >39.00)
LDL CALC: 109 mg/dL — AB (ref 0–99)
NONHDL: 139.48
TRIGLYCERIDES: 151 mg/dL — AB (ref 0.0–149.0)
VLDL: 30.2 mg/dL (ref 0.0–40.0)

## 2015-02-02 LAB — C-REACTIVE PROTEIN: CRP: 1.7 mg/dL (ref 0.5–20.0)

## 2015-02-02 LAB — VITAMIN B12: Vitamin B-12: 601 pg/mL (ref 211–911)

## 2015-02-02 LAB — VITAMIN D 25 HYDROXY (VIT D DEFICIENCY, FRACTURES): VITD: 24.08 ng/mL — ABNORMAL LOW (ref 30.00–100.00)

## 2015-02-02 LAB — SEDIMENTATION RATE: Sed Rate: 48 mm/hr — ABNORMAL HIGH (ref 0–22)

## 2015-02-02 NOTE — Patient Instructions (Signed)
Preventive Care for Adults, Female A healthy lifestyle and preventive care can promote health and wellness. Preventive health guidelines for women include the following key practices.  A routine yearly physical is a good way to check with your health care provider about your health and preventive screening. It is a chance to share any concerns and updates on your health and to receive a thorough exam.  Visit your dentist for a routine exam and preventive care every 6 months. Brush your teeth twice a day and floss once a day. Good oral hygiene prevents tooth decay and gum disease.  The frequency of eye exams is based on your age, health, family medical history, use of contact lenses, and other factors. Follow your health care provider's recommendations for frequency of eye exams.  Eat a healthy diet. Foods like vegetables, fruits, whole grains, low-fat dairy products, and lean protein foods contain the nutrients you need without too many calories. Decrease your intake of foods high in solid fats, added sugars, and salt. Eat the right amount of calories for you.Get information about a proper diet from your health care provider, if necessary.  Regular physical exercise is one of the most important things you can do for your health. Most adults should get at least 150 minutes of moderate-intensity exercise (any activity that increases your heart rate and causes you to sweat) each week. In addition, most adults need muscle-strengthening exercises on 2 or more days a week.  Maintain a healthy weight. The body mass index (BMI) is a screening tool to identify possible weight problems. It provides an estimate of body fat based on height and weight. Your health care provider can find your BMI and can help you achieve or maintain a healthy weight.For adults 20 years and older:  A BMI below 18.5 is considered underweight.  A BMI of 18.5 to 24.9 is normal.  A BMI of 25 to 29.9 is considered overweight.  A  BMI of 30 and above is considered obese.  Maintain normal blood lipids and cholesterol levels by exercising and minimizing your intake of saturated fat. Eat a balanced diet with plenty of fruit and vegetables. Blood tests for lipids and cholesterol should begin at age 45 and be repeated every 5 years. If your lipid or cholesterol levels are high, you are over 50, or you are at high risk for heart disease, you may need your cholesterol levels checked more frequently.Ongoing high lipid and cholesterol levels should be treated with medicines if diet and exercise are not working.  If you smoke, find out from your health care provider how to quit. If you do not use tobacco, do not start.  Lung cancer screening is recommended for adults aged 45-80 years who are at high risk for developing lung cancer because of a history of smoking. A yearly low-dose CT scan of the lungs is recommended for people who have at least a 30-pack-year history of smoking and are a current smoker or have quit within the past 15 years. A pack year of smoking is smoking an average of 1 pack of cigarettes a day for 1 year (for example: 1 pack a day for 30 years or 2 packs a day for 15 years). Yearly screening should continue until the smoker has stopped smoking for at least 15 years. Yearly screening should be stopped for people who develop a health problem that would prevent them from having lung cancer treatment.  If you are pregnant, do not drink alcohol. If you are  breastfeeding, be very cautious about drinking alcohol. If you are not pregnant and choose to drink alcohol, do not have more than 1 drink per day. One drink is considered to be 12 ounces (355 mL) of beer, 5 ounces (148 mL) of wine, or 1.5 ounces (44 mL) of liquor.  Avoid use of street drugs. Do not share needles with anyone. Ask for help if you need support or instructions about stopping the use of drugs.  High blood pressure causes heart disease and increases the risk  of stroke. Your blood pressure should be checked at least every 1 to 2 years. Ongoing high blood pressure should be treated with medicines if weight loss and exercise do not work.  If you are 55-79 years old, ask your health care provider if you should take aspirin to prevent strokes.  Diabetes screening is done by taking a blood sample to check your blood glucose level after you have not eaten for a certain period of time (fasting). If you are not overweight and you do not have risk factors for diabetes, you should be screened once every 3 years starting at age 45. If you are overweight or obese and you are 40-70 years of age, you should be screened for diabetes every year as part of your cardiovascular risk assessment.  Breast cancer screening is essential preventive care for women. You should practice "breast self-awareness." This means understanding the normal appearance and feel of your breasts and may include breast self-examination. Any changes detected, no matter how small, should be reported to a health care provider. Women in their 20s and 30s should have a clinical breast exam (CBE) by a health care provider as part of a regular health exam every 1 to 3 years. After age 40, women should have a CBE every year. Starting at age 40, women should consider having a mammogram (breast X-ray test) every year. Women who have a family history of breast cancer should talk to their health care provider about genetic screening. Women at a high risk of breast cancer should talk to their health care providers about having an MRI and a mammogram every year.  Breast cancer gene (BRCA)-related cancer risk assessment is recommended for women who have family members with BRCA-related cancers. BRCA-related cancers include breast, ovarian, tubal, and peritoneal cancers. Having family members with these cancers may be associated with an increased risk for harmful changes (mutations) in the breast cancer genes BRCA1 and  BRCA2. Results of the assessment will determine the need for genetic counseling and BRCA1 and BRCA2 testing.  Your health care provider may recommend that you be screened regularly for cancer of the pelvic organs (ovaries, uterus, and vagina). This screening involves a pelvic examination, including checking for microscopic changes to the surface of your cervix (Pap test). You may be encouraged to have this screening done every 3 years, beginning at age 21.  For women ages 30-65, health care providers may recommend pelvic exams and Pap testing every 3 years, or they may recommend the Pap and pelvic exam, combined with testing for human papilloma virus (HPV), every 5 years. Some types of HPV increase your risk of cervical cancer. Testing for HPV may also be done on women of any age with unclear Pap test results.  Other health care providers may not recommend any screening for nonpregnant women who are considered low risk for pelvic cancer and who do not have symptoms. Ask your health care provider if a screening pelvic exam is right for   you.  If you have had past treatment for cervical cancer or a condition that could lead to cancer, you need Pap tests and screening for cancer for at least 20 years after your treatment. If Pap tests have been discontinued, your risk factors (such as having a new sexual partner) need to be reassessed to determine if screening should resume. Some women have medical problems that increase the chance of getting cervical cancer. In these cases, your health care provider may recommend more frequent screening and Pap tests.  Colorectal cancer can be detected and often prevented. Most routine colorectal cancer screening begins at the age of 50 years and continues through age 75 years. However, your health care provider may recommend screening at an earlier age if you have risk factors for colon cancer. On a yearly basis, your health care provider may provide home test kits to check  for hidden blood in the stool. Use of a small camera at the end of a tube, to directly examine the colon (sigmoidoscopy or colonoscopy), can detect the earliest forms of colorectal cancer. Talk to your health care provider about this at age 50, when routine screening begins. Direct exam of the colon should be repeated every 5-10 years through age 75 years, unless early forms of precancerous polyps or small growths are found.  People who are at an increased risk for hepatitis B should be screened for this virus. You are considered at high risk for hepatitis B if:  You were born in a country where hepatitis B occurs often. Talk with your health care provider about which countries are considered high risk.  Your parents were born in a high-risk country and you have not received a shot to protect against hepatitis B (hepatitis B vaccine).  You have HIV or AIDS.  You use needles to inject street drugs.  You live with, or have sex with, someone who has hepatitis B.  You get hemodialysis treatment.  You take certain medicines for conditions like cancer, organ transplantation, and autoimmune conditions.  Hepatitis C blood testing is recommended for all people born from 1945 through 1965 and any individual with known risks for hepatitis C.  Practice safe sex. Use condoms and avoid high-risk sexual practices to reduce the spread of sexually transmitted infections (STIs). STIs include gonorrhea, chlamydia, syphilis, trichomonas, herpes, HPV, and human immunodeficiency virus (HIV). Herpes, HIV, and HPV are viral illnesses that have no cure. They can result in disability, cancer, and death.  You should be screened for sexually transmitted illnesses (STIs) including gonorrhea and chlamydia if:  You are sexually active and are younger than 24 years.  You are older than 24 years and your health care provider tells you that you are at risk for this type of infection.  Your sexual activity has changed  since you were last screened and you are at an increased risk for chlamydia or gonorrhea. Ask your health care provider if you are at risk.  If you are at risk of being infected with HIV, it is recommended that you take a prescription medicine daily to prevent HIV infection. This is called preexposure prophylaxis (PrEP). You are considered at risk if:  You are sexually active and do not regularly use condoms or know the HIV status of your partner(s).  You take drugs by injection.  You are sexually active with a partner who has HIV.  Talk with your health care provider about whether you are at high risk of being infected with HIV. If   you choose to begin PrEP, you should first be tested for HIV. You should then be tested every 3 months for as long as you are taking PrEP.  Osteoporosis is a disease in which the bones lose minerals and strength with aging. This can result in serious bone fractures or breaks. The risk of osteoporosis can be identified using a bone density scan. Women ages 67 years and over and women at risk for fractures or osteoporosis should discuss screening with their health care providers. Ask your health care provider whether you should take a calcium supplement or vitamin D to reduce the rate of osteoporosis.  Menopause can be associated with physical symptoms and risks. Hormone replacement therapy is available to decrease symptoms and risks. You should talk to your health care provider about whether hormone replacement therapy is right for you.  Use sunscreen. Apply sunscreen liberally and repeatedly throughout the day. You should seek shade when your shadow is shorter than you. Protect yourself by wearing long sleeves, pants, a wide-brimmed hat, and sunglasses year round, whenever you are outdoors.  Once a month, do a whole body skin exam, using a mirror to look at the skin on your back. Tell your health care provider of new moles, moles that have irregular borders, moles that  are larger than a pencil eraser, or moles that have changed in shape or color.  Stay current with required vaccines (immunizations).  Influenza vaccine. All adults should be immunized every year.  Tetanus, diphtheria, and acellular pertussis (Td, Tdap) vaccine. Pregnant women should receive 1 dose of Tdap vaccine during each pregnancy. The dose should be obtained regardless of the length of time since the last dose. Immunization is preferred during the 27th-36th week of gestation. An adult who has not previously received Tdap or who does not know her vaccine status should receive 1 dose of Tdap. This initial dose should be followed by tetanus and diphtheria toxoids (Td) booster doses every 10 years. Adults with an unknown or incomplete history of completing a 3-dose immunization series with Td-containing vaccines should begin or complete a primary immunization series including a Tdap dose. Adults should receive a Td booster every 10 years.  Varicella vaccine. An adult without evidence of immunity to varicella should receive 2 doses or a second dose if she has previously received 1 dose. Pregnant females who do not have evidence of immunity should receive the first dose after pregnancy. This first dose should be obtained before leaving the health care facility. The second dose should be obtained 4-8 weeks after the first dose.  Human papillomavirus (HPV) vaccine. Females aged 13-26 years who have not received the vaccine previously should obtain the 3-dose series. The vaccine is not recommended for use in pregnant females. However, pregnancy testing is not needed before receiving a dose. If a female is found to be pregnant after receiving a dose, no treatment is needed. In that case, the remaining doses should be delayed until after the pregnancy. Immunization is recommended for any person with an immunocompromised condition through the age of 61 years if she did not get any or all doses earlier. During the  3-dose series, the second dose should be obtained 4-8 weeks after the first dose. The third dose should be obtained 24 weeks after the first dose and 16 weeks after the second dose.  Zoster vaccine. One dose is recommended for adults aged 30 years or older unless certain conditions are present.  Measles, mumps, and rubella (MMR) vaccine. Adults born  before 1957 generally are considered immune to measles and mumps. Adults born in 1957 or later should have 1 or more doses of MMR vaccine unless there is a contraindication to the vaccine or there is laboratory evidence of immunity to each of the three diseases. A routine second dose of MMR vaccine should be obtained at least 28 days after the first dose for students attending postsecondary schools, health care workers, or international travelers. People who received inactivated measles vaccine or an unknown type of measles vaccine during 1963-1967 should receive 2 doses of MMR vaccine. People who received inactivated mumps vaccine or an unknown type of mumps vaccine before 1979 and are at high risk for mumps infection should consider immunization with 2 doses of MMR vaccine. For females of childbearing age, rubella immunity should be determined. If there is no evidence of immunity, females who are not pregnant should be vaccinated. If there is no evidence of immunity, females who are pregnant should delay immunization until after pregnancy. Unvaccinated health care workers born before 1957 who lack laboratory evidence of measles, mumps, or rubella immunity or laboratory confirmation of disease should consider measles and mumps immunization with 2 doses of MMR vaccine or rubella immunization with 1 dose of MMR vaccine.  Pneumococcal 13-valent conjugate (PCV13) vaccine. When indicated, a person who is uncertain of his immunization history and has no record of immunization should receive the PCV13 vaccine. All adults 65 years of age and older should receive this  vaccine. An adult aged 19 years or older who has certain medical conditions and has not been previously immunized should receive 1 dose of PCV13 vaccine. This PCV13 should be followed with a dose of pneumococcal polysaccharide (PPSV23) vaccine. Adults who are at high risk for pneumococcal disease should obtain the PPSV23 vaccine at least 8 weeks after the dose of PCV13 vaccine. Adults older than 54 years of age who have normal immune system function should obtain the PPSV23 vaccine dose at least 1 year after the dose of PCV13 vaccine.  Pneumococcal polysaccharide (PPSV23) vaccine. When PCV13 is also indicated, PCV13 should be obtained first. All adults aged 65 years and older should be immunized. An adult younger than age 65 years who has certain medical conditions should be immunized. Any person who resides in a nursing home or long-term care facility should be immunized. An adult smoker should be immunized. People with an immunocompromised condition and certain other conditions should receive both PCV13 and PPSV23 vaccines. People with human immunodeficiency virus (HIV) infection should be immunized as soon as possible after diagnosis. Immunization during chemotherapy or radiation therapy should be avoided. Routine use of PPSV23 vaccine is not recommended for American Indians, [place] Natives, or people younger than 65 years unless there are medical conditions that require PPSV23 vaccine. When indicated, people who have unknown immunization and have no record of immunization should receive PPSV23 vaccine. One-time revaccination 5 years after the first dose of PPSV23 is recommended for people aged 19-64 years who have chronic kidney failure, nephrotic syndrome, asplenia, or immunocompromised conditions. People who received 1-2 doses of PPSV23 before age 65 years should receive another dose of PPSV23 vaccine at age 65 years or later if at least 5 years have passed since the previous dose. Doses of PPSV23 are not  needed for people immunized with PPSV23 at or after age 65 years.  Meningococcal vaccine. Adults with asplenia or persistent complement component deficiencies should receive 2 doses of quadrivalent meningococcal conjugate (MenACWY-D) vaccine. The doses should be obtained   at least 2 months apart. Microbiologists working with certain meningococcal bacteria, [place] recruits, people at risk during an outbreak, and people who travel to or live in countries with a high rate of meningitis should be immunized. A first-year college student up through age 34 years who is living in a residence hall should receive a dose if she did not receive a dose on or after her 16th birthday. Adults who have certain high-risk conditions should receive one or more doses of vaccine.  Hepatitis A vaccine. Adults who wish to be protected from this disease, have certain high-risk conditions, work with hepatitis A-infected animals, work in hepatitis A research labs, or travel to or work in countries with a high rate of hepatitis A should be immunized. Adults who were previously unvaccinated and who anticipate close contact with an international adoptee during the first 60 days after arrival in the [place] from a country with a high rate of hepatitis A should be immunized.  Hepatitis B vaccine. Adults who wish to be protected from this disease, have certain high-risk conditions, may be exposed to blood or other infectious body fluids, are household contacts or sex partners of hepatitis B positive people, are clients or workers in certain care facilities, or travel to or work in countries with a high rate of hepatitis B should be immunized.  Haemophilus influenzae type b (Hib) vaccine. A previously unvaccinated person with asplenia or sickle cell disease or having a scheduled splenectomy should receive 1 dose of Hib vaccine. Regardless of previous immunization, a recipient of a hematopoietic stem cell transplant should receive a  3-dose series 6-12 months after her successful transplant. Hib vaccine is not recommended for adults with HIV infection. Preventive Services / Frequency Ages 35 to 4 years  Blood pressure check.** / Every 3-5 years.  Lipid and cholesterol check.** / Every 5 years beginning at age 60.  Clinical breast exam.** / Every 3 years for women in their 71s and 10s.  BRCA-related cancer risk assessment.** / For women who have family members with a BRCA-related cancer (breast, ovarian, tubal, or peritoneal cancers).  Pap test.** / Every 2 years from ages 76 through 26. Every 3 years starting at age 61 through age 76 or 93 with a history of 3 consecutive normal Pap tests.  HPV screening.** / Every 3 years from ages 37 through ages 60 to 51 with a history of 3 consecutive normal Pap tests.  Hepatitis C blood test.** / For any individual with known risks for hepatitis C.  Skin self-exam. / Monthly.  Influenza vaccine. / Every year.  Tetanus, diphtheria, and acellular pertussis (Tdap, Td) vaccine.** / Consult your health care provider. Pregnant women should receive 1 dose of Tdap vaccine during each pregnancy. 1 dose of Td every 10 years.  Varicella vaccine.** / Consult your health care provider. Pregnant females who do not have evidence of immunity should receive the first dose after pregnancy.  HPV vaccine. / 3 doses over 6 months, if 93 and younger. The vaccine is not recommended for use in pregnant females. However, pregnancy testing is not needed before receiving a dose.  Measles, mumps, rubella (MMR) vaccine.** / You need at least 1 dose of MMR if you were born in 1957 or later. You may also need a 2nd dose. For females of childbearing age, rubella immunity should be determined. If there is no evidence of immunity, females who are not pregnant should be vaccinated. If there is no evidence of immunity, females who are  pregnant should delay immunization until after pregnancy.  Pneumococcal  13-valent conjugate (PCV13) vaccine.** / Consult your health care provider.  Pneumococcal polysaccharide (PPSV23) vaccine.** / 1 to 2 doses if you smoke cigarettes or if you have certain conditions.  Meningococcal vaccine.** / 1 dose if you are age 68 to 8 years and a Market researcher living in a residence hall, or have one of several medical conditions, you need to get vaccinated against meningococcal disease. You may also need additional booster doses.  Hepatitis A vaccine.** / Consult your health care provider.  Hepatitis B vaccine.** / Consult your health care provider.  Haemophilus influenzae type b (Hib) vaccine.** / Consult your health care provider. Ages 7 to 53 years  Blood pressure check.** / Every year.  Lipid and cholesterol check.** / Every 5 years beginning at age 25 years.  Lung cancer screening. / Every year if you are aged 11-80 years and have a 30-pack-year history of smoking and currently smoke or have quit within the past 15 years. Yearly screening is stopped once you have quit smoking for at least 15 years or develop a health problem that would prevent you from having lung cancer treatment.  Clinical breast exam.** / Every year after age 48 years.  BRCA-related cancer risk assessment.** / For women who have family members with a BRCA-related cancer (breast, ovarian, tubal, or peritoneal cancers).  Mammogram.** / Every year beginning at age 41 years and continuing for as long as you are in good health. Consult with your health care provider.  Pap test.** / Every 3 years starting at age 65 years through age 37 or 70 years with a history of 3 consecutive normal Pap tests.  HPV screening.** / Every 3 years from ages 72 years through ages 60 to 40 years with a history of 3 consecutive normal Pap tests.  Fecal occult blood test (FOBT) of stool. / Every year beginning at age 21 years and continuing until age 5 years. You may not need to do this test if you get  a colonoscopy every 10 years.  Flexible sigmoidoscopy or colonoscopy.** / Every 5 years for a flexible sigmoidoscopy or every 10 years for a colonoscopy beginning at age 35 years and continuing until age 48 years.  Hepatitis C blood test.** / For all people born from [birth year] through 1965 and any individual with known risks for hepatitis C.  Skin self-exam. / Monthly.  Influenza vaccine. / Every year.  Tetanus, diphtheria, and acellular pertussis (Tdap/Td) vaccine.** / Consult your health care provider. Pregnant women should receive 1 dose of Tdap vaccine during each pregnancy. 1 dose of Td every 10 years.  Varicella vaccine.** / Consult your health care provider. Pregnant females who do not have evidence of immunity should receive the first dose after pregnancy.  Zoster vaccine.** / 1 dose for adults aged 30 years or older.  Measles, mumps, rubella (MMR) vaccine.** / You need at least 1 dose of MMR if you were born in 1957 or later. You may also need a second dose. For females of childbearing age, rubella immunity should be determined. If there is no evidence of immunity, females who are not pregnant should be vaccinated. If there is no evidence of immunity, females who are pregnant should delay immunization until after pregnancy.  Pneumococcal 13-valent conjugate (PCV13) vaccine.** / Consult your health care provider.  Pneumococcal polysaccharide (PPSV23) vaccine.** / 1 to 2 doses if you smoke cigarettes or if you have certain conditions.  Meningococcal vaccine.** /  Consult your health care provider.  Hepatitis A vaccine.** / Consult your health care provider.  Hepatitis B vaccine.** / Consult your health care provider.  Haemophilus influenzae type b (Hib) vaccine.** / Consult your health care provider. Ages 64 years and over  Blood pressure check.** / Every year.  Lipid and cholesterol check.** / Every 5 years beginning at age 23 years.  Lung cancer screening. / Every year if you  are aged 16-80 years and have a 30-pack-year history of smoking and currently smoke or have quit within the past 15 years. Yearly screening is stopped once you have quit smoking for at least 15 years or develop a health problem that would prevent you from having lung cancer treatment.  Clinical breast exam.** / Every year after age 74 years.  BRCA-related cancer risk assessment.** / For women who have family members with a BRCA-related cancer (breast, ovarian, tubal, or peritoneal cancers).  Mammogram.** / Every year beginning at age 44 years and continuing for as long as you are in good health. Consult with your health care provider.  Pap test.** / Every 3 years starting at age 58 years through age 22 or 39 years with 3 consecutive normal Pap tests. Testing can be stopped between 65 and 70 years with 3 consecutive normal Pap tests and no abnormal Pap or HPV tests in the past 10 years.  HPV screening.** / Every 3 years from ages 64 years through ages 70 or 61 years with a history of 3 consecutive normal Pap tests. Testing can be stopped between 65 and 70 years with 3 consecutive normal Pap tests and no abnormal Pap or HPV tests in the past 10 years.  Fecal occult blood test (FOBT) of stool. / Every year beginning at age 40 years and continuing until age 27 years. You may not need to do this test if you get a colonoscopy every 10 years.  Flexible sigmoidoscopy or colonoscopy.** / Every 5 years for a flexible sigmoidoscopy or every 10 years for a colonoscopy beginning at age 7 years and continuing until age 32 years.  Hepatitis C blood test.** / For all people born from [birth year] through 1965 and any individual with known risks for hepatitis C.  Osteoporosis screening.** / A one-time screening for women ages 30 years and over and women at risk for fractures or osteoporosis.  Skin self-exam. / Monthly.  Influenza vaccine. / Every year.  Tetanus, diphtheria, and acellular pertussis (Tdap/Td)  vaccine.** / 1 dose of Td every 10 years.  Varicella vaccine.** / Consult your health care provider.  Zoster vaccine.** / 1 dose for adults aged 35 years or older.  Pneumococcal 13-valent conjugate (PCV13) vaccine.** / Consult your health care provider.  Pneumococcal polysaccharide (PPSV23) vaccine.** / 1 dose for all adults aged 46 years and older.  Meningococcal vaccine.** / Consult your health care provider.  Hepatitis A vaccine.** / Consult your health care provider.  Hepatitis B vaccine.** / Consult your health care provider.  Haemophilus influenzae type b (Hib) vaccine.** / Consult your health care provider. ** Family history and personal history of risk and conditions may change your health care provider's recommendations.   This information is not intended to replace advice given to you by your health care provider. Make sure you discuss any questions you have with your health care provider.   Document Released: 04/19/2001 Document Revised: 03/14/2014 Document Reviewed: 07/19/2010 Elsevier Interactive Patient Education [employer].

## 2015-02-02 NOTE — Assessment & Plan Note (Signed)
Pap today, no concerns on exam. Menarche at 6, stop at 7.5, restarted at 10 Regular and heavy flow and painful from endocmetriosis No history of abnormal pap in past G5P2, s/p 3 miscarriages, 2 svd No history of abnormal MGM, never had a MGM, no concerns, nursed 8 kids, declines MGM for now No concerns today  gyn surgeries: right oophorectomy for an endometrioma LMP at age 54

## 2015-02-02 NOTE — Progress Notes (Signed)
Pre visit review using our clinic review tool, if applicable. No additional management support is needed unless otherwise documented below in the visit note. 

## 2015-02-03 ENCOUNTER — Other Ambulatory Visit: Payer: Self-pay | Admitting: Family Medicine

## 2015-02-03 ENCOUNTER — Telehealth: Payer: Self-pay | Admitting: Family Medicine

## 2015-02-03 MED ORDER — VITAMIN D (ERGOCALCIFEROL) 1.25 MG (50000 UNIT) PO CAPS: 50000.0000 [IU] | ORAL_CAPSULE | ORAL | Status: DC

## 2015-02-03 NOTE — Telephone Encounter (Signed)
Patient was just seen in the office and had already been documented.

## 2015-02-03 NOTE — Telephone Encounter (Signed)
Called patient to schedule Flu Shot on 11/29. Patient declined. States she does not take them

## 2015-02-04 LAB — CYTOLOGY - PAP

## 2015-02-07 ENCOUNTER — Encounter: Payer: Self-pay | Admitting: Family Medicine

## 2015-02-07 DIAGNOSIS — Z0000 Encounter for general adult medical examination without abnormal findings: Secondary | ICD-10-CM

## 2015-02-07 HISTORY — DX: Encounter for general adult medical examination without abnormal findings: Z00.00

## 2015-02-07 NOTE — Assessment & Plan Note (Signed)
resolved 

## 2015-02-07 NOTE — Assessment & Plan Note (Signed)
Encouraged DASH diet, decrease po intake and increase exercise as tolerated. Needs 7-8 hours of sleep nightly. Avoid trans fats, eat small, frequent meals every 4-5 hours with lean proteins, complex carbs and healthy fats. Minimize simple carbs 

## 2015-02-07 NOTE — Progress Notes (Signed)
Subjective:    Patient ID: Hailey Cochran, female    DOB: 06-Jun-1960, 54 y.o.   MRN: [id number]  Chief Complaint  Patient presents with  . Annual Exam    HPI Patient is in today for very stressed having lost both her mother and her maternal grandmother recently to Parkinson's and dementia. 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 She acknowledges some depression but feel as if she is managing fairly well. Is complaining of right thumb pain status post an injury back in February and is interested in a referral to have that evaluated. She is also noting roughly one-month history of some increased nasal congestion and mild cough. No fevers or chills. No GYN complaints. Denies CP/palp/SOB/HA/congestion/fevers/GI or GU c/o. Taking meds as prescribed  Past Medical History  Diagnosis Date  . Fibromyalgia   . Lumbar herniated disc   . History of endometriosis   . Elevated liver enzymes   . Endometriosis   . Glaucoma   . Anemia   . Vitamin B 12 deficiency   . 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 Elevated antinuclear antibody (ANA) level   . Premature ovarian failure     30s  . Overweight 09/29/2014  . Cervical cancer screening 02/02/2015    Menarche at 6, stop at 7.5, restarted at 10 Regular and heavy flow and painful from endocmetriosis No history of abnormal pap in past G5P2, s/p 3 miscarriages, 2 svd No history of abnormal MGM, never had a MGM, no concerns, nursed 8 kids, declines MGM for now No concerns today  gyn surgeries: right oophorectomy for an endometrioma LMP at age 30  . Preventative health care 02/07/2015    Past Surgical History  Procedure Laterality Date  . Salpingoophorectomy Right 1990    endometriosis    Family History  Problem Relation Age of Onset  . Dementia Mother     "lewey body"  . Leukemia Mother   . Diabetes Mother     diet controlled  . Hypertension Mother     diet controlled  . Parkinson's disease Mother     . Hepatitis C Father      2 strains  . Cancer Father     hepatocellular carcinoma  . Hypertension Father   . Bullous pemphigoid Father   . Alcohol abuse Father     drug abuse  . Muscular dystrophy Paternal Uncle   . Other Sister     fatty liver disease  . Rickets Son   . Gallstones Maternal Aunt   . Anemia Maternal Aunt   . Diabetes Maternal Grandmother   . Dementia Maternal Grandmother   . Gallstones Maternal Grandfather   . Heart disease Maternal Grandfather     cardiomegaly  . Alzheimer's disease Paternal Grandfather   . Hypertension Paternal Grandfather   . Single kidney Daughter   . Eczema Son     Social History   Social History  . Marital Status: Married    Spouse Name: N/A  . Number of Children: 8  . 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 Smoking status: Never Smoker   . Smokeless tobacco: Never Used  . Alcohol Use: No  . Drug Use: No  . Sexual Activity: Yes     Comment: lives with children, husband, sister in law and GM, no dietary restrictions avoids pork and alcohol.   Other Topics  Concern  . Not on file   Social History Narrative   2 biological children 2- daughter Hailey Cochran                                    70- Hailey Cochran   3 miscarraiges       Adopted children:   77 year old- Hailey Cochran   54 year old Hailey Cochran   54 year old Hailey Cochran   7 Hailey Cochran   6 Hailey Cochran   6 Hailey Cochran      Married- Hailey Cochran   Homeschools her children   Enjoys Corporate investment banker, Barrister's clerk, dancing   Christian, jewish heritage    Outpatient Prescriptions Prior to Visit  Medication Sig Dispense Refill  . EPINEPHrine 0.3 mg/0.3 mL IJ SOAJ injection Inject 0.3 mLs (0.3 mg total) into the muscle as needed. 2 Device 1  . Vitamin D, Ergocalciferol, (DRISDOL) 50000 UNITS CAPS capsule Take 1 capsule (50,000 Units total) by mouth every 7 (seven) days. 12 capsule 0   No facility-administered medications prior to visit.     Allergies  Allergen Reactions  . Morphine   . Other     ANTS.  Causes numbness / paralysis per pt  . Wasp Venom Other (See Comments)    Numbness / paralysis    Review of Systems  Constitutional: Positive for malaise/fatigue. Negative for fever and chills.  HENT: Positive for congestion. Negative for hearing loss.   Eyes: Negative for discharge.  Respiratory: Positive for cough. Negative for sputum production and shortness of breath.   Cardiovascular: Negative for chest pain, palpitations and leg swelling.  Gastrointestinal: Negative for heartburn, nausea, vomiting, abdominal pain, diarrhea, constipation and blood in stool.  Genitourinary: Negative for dysuria, urgency, frequency and hematuria.  Musculoskeletal: Positive for joint pain. Negative for myalgias, back pain and falls.  Skin: Negative for rash.  Neurological: Negative for dizziness, sensory change, loss of consciousness, weakness and headaches.  Endo/Heme/Allergies: Negative for environmental allergies. Does not bruise/bleed easily.  Psychiatric/Behavioral: Negative for depression and suicidal ideas. The patient is nervous/anxious. The patient does not have insomnia.        Objective:    Physical Exam  Constitutional: She is oriented to person, place, and time. She appears well-developed and well-nourished. No distress.  HENT:  Head: Normocephalic and atraumatic.  Eyes: Conjunctivae are normal.  Neck: Neck supple. No thyromegaly present.  Cardiovascular: Normal rate, regular rhythm and normal heart sounds.   No murmur heard. Pulmonary/Chest: Effort normal and breath sounds normal. No respiratory distress.  Abdominal: Soft. Bowel sounds are normal. She exhibits no distension and no mass. There is no tenderness.  Genitourinary: Vagina normal and uterus normal. No vaginal discharge found.  No vaginal, vulvar or cervical lesions. Breast exam unremarkable. No skin lesions, no masses and no discharge  Musculoskeletal:  She exhibits no edema.  Lymphadenopathy:    She has no cervical adenopathy.  Neurological: She is alert and oriented to person, place, and time.  Skin: Skin is warm and dry.  Psychiatric: She has a normal mood and affect. Her behavior is normal.    BP 98/68 mmHg  Pulse 82  Temp(Src) 98.1 F (36.7 C) (Oral)  Ht $R'5\' 11"'TA$  (1.803 m)  Wt 194 lb (87.998 kg)  BMI 27.07 kg/m2  SpO2 95% Wt Readings from Last 3 Encounters:  02/02/15 194 lb (87.998 kg)  10/31/14 193 lb 2 oz (87.601 kg)  09/29/14 192 lb (  87.091 kg)     Lab Results  Component Value Date   WBC 8.1 02/02/2015   HGB 13.5 02/02/2015   HCT 41.0 02/02/2015   PLT 277.0 02/02/2015   GLUCOSE 105* 02/02/2015   CHOL 189 02/02/2015   TRIG 151.0* 02/02/2015   HDL 49.90 02/02/2015   LDLCALC 109* 02/02/2015   ALT 131* 02/02/2015   AST 87* 02/02/2015   NA 140 02/02/2015   K 4.0 02/02/2015   CL 104 02/02/2015   CREATININE 0.69 02/02/2015   BUN 13 02/02/2015   CO2 28 02/02/2015   TSH 4.02 02/02/2015    Lab Results  Component Value Date   TSH 4.02 02/02/2015   Lab Results  Component Value Date   WBC 8.1 02/02/2015   HGB 13.5 02/02/2015   HCT 41.0 02/02/2015   MCV 94.8 02/02/2015   PLT 277.0 02/02/2015   Lab Results  Component Value Date   NA 140 02/02/2015   K 4.0 02/02/2015   CO2 28 02/02/2015   GLUCOSE 105* 02/02/2015   BUN 13 02/02/2015   CREATININE 0.69 02/02/2015   BILITOT 0.3 02/02/2015   ALKPHOS 175* 02/02/2015   AST 87* 02/02/2015   ALT 131* 02/02/2015   PROT 7.4 02/02/2015   ALBUMIN 3.9 02/02/2015   CALCIUM 9.2 02/02/2015   GFR 93.98 02/02/2015   Lab Results  Component Value Date   CHOL 189 02/02/2015   Lab Results  Component Value Date   HDL 49.90 02/02/2015   Lab Results  Component Value Date   LDLCALC 109* 02/02/2015   Lab Results  Component Value Date   TRIG 151.0* 02/02/2015   Lab Results  Component Value Date   CHOLHDL 4 02/02/2015   No results found for: HGBA1C       Assessment & Plan:   Problem List Items Addressed This Visit    Abnormal liver function test    Elevated again with restarting herbal treatment, encouraged to decrease use again      Relevant Orders   TSH (Completed)   CBC (Completed)   Comprehensive metabolic panel (Completed)   VITAMIN D 25 Hydroxy (Vit-D Deficiency, Fractures) (Completed)   Lipid panel (Completed)   Vitamin B12 (Completed)   Anemia    resolved      Relevant Orders   TSH (Completed)   CBC (Completed)   Comprehensive metabolic panel (Completed)   VITAMIN D 25 Hydroxy (Vit-D Deficiency, Fractures) (Completed)   Lipid panel (Completed)   Vitamin B12 (Completed)   Cervical cancer screening    Pap today, no concerns on exam. Menarche at 6, stop at 7.5, restarted at 10 Regular and heavy flow and painful from endocmetriosis No history of abnormal pap in past G5P2, s/p 3 miscarriages, 2 svd No history of abnormal MGM, never had a MGM, no concerns, nursed 8 kids, declines MGM for now No concerns today  gyn surgeries: right oophorectomy for an endometrioma LMP at age 83      Relevant Orders   Lipid panel (Completed)   Vitamin B12 (Completed)   Cytology - PAP (Completed)   Low back pain with left-sided sciatica    Encouraged moist heat and gentle stretching as tolerated. May try NSAIDs and prescription meds as directed and report if symptoms worsen or seek immediate care      Relevant Orders   TSH (Completed)   CBC (Completed)   Comprehensive metabolic panel (Completed)   VITAMIN D 25 Hydroxy (Vit-D Deficiency, Fractures) (Completed)   Lipid panel (Completed)  Vitamin B12 (Completed)   Overweight    Encouraged DASH diet, decrease po intake and increase exercise as tolerated. Needs 7-8 hours of sleep nightly. Avoid trans fats, eat small, frequent meals every 4-5 hours with lean proteins, complex carbs and healthy fats. Minimize simple carbs      Relevant Orders   TSH (Completed)   CBC (Completed)    Comprehensive metabolic panel (Completed)   VITAMIN D 25 Hydroxy (Vit-D Deficiency, Fractures) (Completed)   Lipid panel (Completed)   Vitamin B12 (Completed)   Pain of right thumb    Ice and topical treatments and refer to orthopaedics for further treatment      Preventative health care    Patient encouraged to maintain heart healthy diet, regular exercise, adequate sleep. Consider daily probiotics. Take medications as prescribed. Labs ordered and reviewed      Vitamin B 12 deficiency   Relevant Orders   TSH (Completed)   CBC (Completed)   Comprehensive metabolic panel (Completed)   VITAMIN D 25 Hydroxy (Vit-D Deficiency, Fractures) (Completed)   Lipid panel (Completed)   Vitamin B12 (Completed)   Vitamin D deficiency - Primary    Start supplements daily      Relevant Orders   TSH (Completed)   CBC (Completed)   Comprehensive metabolic panel (Completed)   VITAMIN D 25 Hydroxy (Vit-D Deficiency, Fractures) (Completed)   Lipid panel (Completed)   Vitamin B12 (Completed)    Other Visit Diagnoses    Elevated sed rate        Relevant Orders    Lipid panel (Completed)    Vitamin B12 (Completed)    Sed Rate (ESR) (Completed)    C-reactive protein (Completed)    Other fatigue        Thumb pain, right        Relevant Orders    Ambulatory referral to Orthopedic Surgery       I am having Ms. Kniseley maintain her EPINEPHrine, L-THEANINE PO, Hops (Humulus Lupulus), and TURMERIC CURCUMIN PO.  Meds ordered this encounter  Medications  . L-THEANINE PO    Sig: Take by mouth daily.  . Hops, Humulus Lupulus, POWD    Sig: daily.  . TURMERIC CURCUMIN PO    Sig: Take by mouth daily.     Penni Homans, MD

## 2015-02-07 NOTE — Assessment & Plan Note (Signed)
Encouraged moist heat and gentle stretching as tolerated. May try NSAIDs and prescription meds as directed and report if symptoms worsen or seek immediate care 

## 2015-02-07 NOTE — Assessment & Plan Note (Signed)
Elevated again with restarting herbal treatment, encouraged to decrease use again

## 2015-02-07 NOTE — Assessment & Plan Note (Signed)
Start supplements daily

## 2015-02-07 NOTE — Assessment & Plan Note (Signed)
Ice and topical treatments and refer to orthopaedics for further treatment

## 2015-02-07 NOTE — Assessment & Plan Note (Signed)
Patient encouraged to maintain heart healthy diet, regular exercise, adequate sleep. Consider daily probiotics. Take medications as prescribed. Labs ordered and reviewed 

## 2015-03-25 ENCOUNTER — Other Ambulatory Visit: Payer: Self-pay | Admitting: Family Medicine

## 2015-04-06 ENCOUNTER — Encounter: Payer: 59 | Admitting: Family Medicine

## 2015-04-09 ENCOUNTER — Telehealth: Payer: Self-pay | Admitting: Behavioral Health

## 2015-04-09 NOTE — Telephone Encounter (Signed)
The following gaps in care were assessed: Mammogram (patient declines) Pap (completed on 02/02/15) Colon Flu (patient declines)  Patient voiced that she has not had a recent colonoscopy and declines to have the procedure done at this time.

## 2015-05-01 ENCOUNTER — Encounter: Payer: 59 | Admitting: Family Medicine

## 2015-06-04 ENCOUNTER — Encounter: Payer: Self-pay | Admitting: Family Medicine

## 2015-06-04 ENCOUNTER — Ambulatory Visit (INDEPENDENT_AMBULATORY_CARE_PROVIDER_SITE_OTHER): Payer: BLUE CROSS/BLUE SHIELD | Admitting: Family Medicine

## 2015-06-04 VITALS — BP 110/64 | HR 79 | Temp 98.6°F | Ht 71.0 in | Wt 202.4 lb

## 2015-06-04 DIAGNOSIS — R059 Cough, unspecified: Secondary | ICD-10-CM

## 2015-06-04 DIAGNOSIS — R05 Cough: Secondary | ICD-10-CM | POA: Diagnosis not present

## 2015-06-04 DIAGNOSIS — E559 Vitamin D deficiency, unspecified: Secondary | ICD-10-CM

## 2015-06-04 MED ORDER — ALBUTEROL SULFATE (2.5 MG/3ML) 0.083% IN NEBU: 2.5000 mg | INHALATION_SOLUTION | Freq: Once | RESPIRATORY_TRACT | Status: DC

## 2015-06-04 MED ORDER — VITAMIN D (ERGOCALCIFEROL) 1.25 MG (50000 UNIT) PO CAPS
ORAL_CAPSULE | ORAL | Status: DC
Start: 2015-06-04 — End: 2016-03-08

## 2015-06-04 NOTE — Progress Notes (Signed)
[facility] [address] [place], [place] [address] [phone]  Date:  06/04/2015   Name:  Hailey Cochran   DOB:  September 16, 1960   MRN:  [id number]  PCP:  Danise Edge, MD    Chief Complaint: Cough   History of Present Illness:  Hailey Cochran is a 55 y.o. very pleasant female patient who presents with the following:  She is here today with illness- she "has been sick since September."  She then seemed to get better, but got concerned when she started coughing last night.  She wants me to listen to her lungs to make sure that all is ok  She has not noted any fever. She does have a runny nose and a ST.  No GI symptoms.  She did have a GI bug last weekend but these sx are now resolved.   She has albuterol at home to use if needed.   She has been under a lot of stress On the way our the door she asks me to refill her vitamin D supplement- states that she was told that it was "very low" and did not take her rx supplement.  She would like for me to refill her rx and then get he rlevel tested  Patient Active Problem List   Diagnosis Date Noted  . Preventative health care 02/07/2015  . Cervical cancer screening 02/02/2015  . Overweight 09/29/2014  . Anemia   . Vitamin B 12 deficiency   . Thyroid disease   . Premature ovarian failure   . Vitamin D deficiency 04/22/2014  . Abnormal liver function test 04/22/2014  . Onychomycosis 04/09/2014  . Low back pain with left-sided sciatica 04/09/2014  . Fibromyalgia 04/09/2014  . Pain of right thumb 02/14/2014    Past Medical History  Diagnosis Date  . Fibromyalgia   . Lumbar herniated disc   . History of endometriosis   . Elevated liver enzymes   . Endometriosis   . Glaucoma   . Anemia   . Vitamin B 12 deficiency   . 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 Elevated antinuclear antibody (ANA) level   . Premature ovarian failure     30s  .  Overweight 09/29/2014  . Cervical cancer screening 02/02/2015    Menarche at 6, stop at 7.5, restarted at 10 Regular and heavy flow and painful from endocmetriosis No history of abnormal pap in past G5P2, s/p 3 miscarriages, 2 svd No history of abnormal MGM, never had a MGM, no concerns, nursed 8 kids, declines MGM for now No concerns today  gyn surgeries: right oophorectomy for an endometrioma LMP at age 55  . Preventative health care 02/07/2015    Past Surgical History  Procedure Laterality Date  . Salpingoophorectomy Right 1990    endometriosis    Social History  Substance Use Topics  . Smoking status: Never Smoker   . Smokeless tobacco: Never Used  . Alcohol Use: No    Family History  Problem Relation Age of Onset  . Dementia Mother     "lewey body"  . Leukemia Mother   . Diabetes Mother     diet controlled  . Hypertension Mother     diet controlled  . Parkinson's disease Mother   . Hepatitis C Father      2 strains  . Cancer Father     hepatocellular carcinoma  . Hypertension Father   .  Bullous pemphigoid Father   . Alcohol abuse Father     drug abuse  . Muscular dystrophy Paternal Uncle   . Other Sister     fatty liver disease  . Rickets Son   . Gallstones Maternal Aunt   . Anemia Maternal Aunt   . Diabetes Maternal Grandmother   . Dementia Maternal Grandmother   . Gallstones Maternal Grandfather   . Heart disease Maternal Grandfather     cardiomegaly  . Alzheimer's disease Paternal Grandfather   . Hypertension Paternal Grandfather   . Single kidney Daughter   . Eczema Son     Allergies  Allergen Reactions  . Morphine   . Other     ANTS.  Causes numbness / paralysis per pt  . Wasp Venom Other (See Comments)    Numbness / paralysis    Medication list has been reviewed and updated.  Current Outpatient Prescriptions on File Prior to Visit  Medication Sig Dispense Refill  . L-THEANINE PO Take by mouth daily.    . TURMERIC CURCUMIN PO Take by mouth  daily.    Marland Kitchen. EPINEPHrine 0.3 mg/0.3 mL IJ SOAJ injection Inject 0.3 mLs (0.3 mg total) into the muscle as needed. (Patient not taking: Reported on 06/04/2015) 2 Device 1  . Vitamin D, Ergocalciferol, (DRISDOL) 50000 units CAPS capsule TAKE 1 CAPSULE BY MOUTH EVERY 7 DAYS (Patient not taking: Reported on 06/04/2015) 12 capsule 0   No current facility-administered medications on file prior to visit.    Review of Systems:  As per HPI- otherwise negative.   Physical Examination: Filed Vitals:   06/04/15 1635  BP: 110/64  Pulse: 79  Temp: 98.6 F (37 C)   Filed Vitals:   06/04/15 1635  Height: 5\' 11"  (1.803 m)  Weight: 202 lb 6.4 oz (91.808 kg)   Body mass index is 28.24 kg/(m^2). Ideal Body Weight: Weight in (lb) to have BMI = 25: 178.9  GEN: WDWN, NAD, Non-toxic, A & O x 3, overweight. Coughing in room but otherwise looks well HEENT: Atraumatic, Normocephalic. Neck supple. No masses, No LAD.  Bilateral TM wnl, oropharynx normal.  PEERL,EOMI.   Ears and Nose: No external deformity. CV: RRR, No M/G/R. No JVD. No thrill. No extra heart sounds. PULM: CTA B, no wheezes, crackles, rhonchi. No retractions. No resp. distress. No accessory muscle use. EXTR: No c/c/e NEURO Normal gait.  PSYCH: Normally interactive. Conversant. Not depressed or anxious appearing.  Calm demeanor.   I had planned to give her a neb here- however she then decided she did not want one and would just use one that she has at home Assessment and Plan: Cough - Plan: albuterol (PROVENTIL) (2.5 MG/3ML) 0.083% nebulizer solution 2.5 mg  Vitamin D deficiency - Plan: Vitamin D, Ergocalciferol, (DRISDOL) 50000 units CAPS capsule  Pt did NOT receive a neb here but I cannot remove order She will use her albuterol at home and let me know if not better Refilled her vitamin D- she will take it for 12 weeks and then plan to come in for a repeat level  Meds ordered this encounter  Medications  . albuterol (PROVENTIL) (2.5  MG/3ML) 0.083% nebulizer solution 2.5 mg    Sig:   . Vitamin D, Ergocalciferol, (DRISDOL) 50000 units CAPS capsule    Sig: TAKE 1 CAPSULE BY MOUTH EVERY 7 DAYS    Dispense:  12 capsule    Refill:  0     Signed Abbe AmsterdamJessica Tanieka Pownall, MD

## 2015-06-04 NOTE — Patient Instructions (Signed)
Please let me know if you are not feeling better soon- ok to try your albuterol at home for your symptoms

## 2015-06-25 ENCOUNTER — Encounter: Payer: Self-pay | Admitting: Family Medicine

## 2015-06-26 ENCOUNTER — Other Ambulatory Visit: Payer: Self-pay | Admitting: Family Medicine

## 2015-06-26 DIAGNOSIS — R1013 Epigastric pain: Secondary | ICD-10-CM

## 2015-06-26 DIAGNOSIS — G8929 Other chronic pain: Secondary | ICD-10-CM

## 2015-06-29 ENCOUNTER — Telehealth: Payer: Self-pay | Admitting: Family Medicine

## 2015-06-29 ENCOUNTER — Other Ambulatory Visit: Payer: BLUE CROSS/BLUE SHIELD

## 2015-06-29 DIAGNOSIS — R1013 Epigastric pain: Secondary | ICD-10-CM

## 2015-06-29 NOTE — Telephone Encounter (Signed)
To clarify, pts insurance requiring her to take written RX to [facility] and have labs drawn there. Please call her when RX can be picked up.

## 2015-06-29 NOTE — Telephone Encounter (Signed)
Please order for [facility] Pylori serum for epigastric pain and dyspepsia

## 2015-06-29 NOTE — Telephone Encounter (Signed)
Pt's spouse called in to check the price of labs that has been ordered. He says that his insurance will not cover.    Please address when calling spouse (pt) back also please.

## 2015-06-29 NOTE — Telephone Encounter (Signed)
Pt called in requesting that her PCP make her lab orders available to take to [facility] lab to have drawn.  CB: [phone]

## 2015-06-30 NOTE — Telephone Encounter (Signed)
Order completed to [facility] as [facility]. States [facility] was not correct.  Put order at the front for patient to pickup at her convenience.

## 2015-06-30 NOTE — Telephone Encounter (Signed)
Pt husband, Remi DeterSamuel, called and asked that the RX be faxed to [phone] - faxed to him directly with successful trasmittal

## 2015-07-02 MED ORDER — CLARITHROMYCIN 500 MG PO TABS: 500.0000 mg | ORAL_TABLET | Freq: Two times a day (BID) | ORAL | Status: DC

## 2015-07-02 MED ORDER — OMEPRAZOLE 20 MG PO CPDR: 20.0000 mg | DELAYED_RELEASE_CAPSULE | Freq: Two times a day (BID) | ORAL | Status: DC

## 2015-07-02 MED ORDER — AMOXICILLIN 500 MG PO TABS
ORAL_TABLET | ORAL | Status: DC
Start: 2015-07-02 — End: 2015-08-24

## 2015-07-02 NOTE — Telephone Encounter (Signed)
Patient has been informed of her results/prescriptions sent in.

## 2015-07-02 NOTE — Telephone Encounter (Signed)
Received from [facility] results for H Pylori--- POSITIVE PCP instructions--Omeprazole 20 mg BID x 10 days                                Biaxin 500 mg BID x 10 days                                Amoxicillin 500 mg 2 tablets BID x 10 days.

## 2015-07-02 NOTE — Addendum Note (Signed)
Addended by: Scharlene GlossEWING, Tracen Mahler B on: 07/02/2015 10:39 AM   Modules accepted: Orders, Medications

## 2015-08-21 ENCOUNTER — Encounter: Payer: Self-pay | Admitting: Family Medicine

## 2015-08-24 ENCOUNTER — Other Ambulatory Visit: Payer: Self-pay | Admitting: Family Medicine

## 2015-08-24 MED ORDER — AMOXICILLIN 500 MG PO TABS: ORAL_TABLET | ORAL | Status: DC

## 2015-08-24 MED ORDER — CLARITHROMYCIN 500 MG PO TABS: 500.0000 mg | ORAL_TABLET | Freq: Two times a day (BID) | ORAL | Status: DC

## 2015-08-24 MED ORDER — OMEPRAZOLE 20 MG PO CPDR: 20.0000 mg | DELAYED_RELEASE_CAPSULE | Freq: Two times a day (BID) | ORAL | Status: DC

## 2015-11-13 ENCOUNTER — Ambulatory Visit (HOSPITAL_BASED_OUTPATIENT_CLINIC_OR_DEPARTMENT_OTHER)
Admission: RE | Admit: 2015-11-13 | Discharge: 2015-11-13 | Disposition: A | Discharge status: Home / Self Care | Payer: BLUE CROSS/BLUE SHIELD | Source: Ambulatory Visit | Attending: Family Medicine | Admitting: Family Medicine

## 2015-11-13 ENCOUNTER — Encounter: Payer: Self-pay | Admitting: Family Medicine

## 2015-11-13 ENCOUNTER — Ambulatory Visit (INDEPENDENT_AMBULATORY_CARE_PROVIDER_SITE_OTHER): Payer: BLUE CROSS/BLUE SHIELD | Admitting: Family Medicine

## 2015-11-13 VITALS — BP 106/60 | HR 88 | Temp 98.1°F | Ht 71.0 in | Wt 200.6 lb

## 2015-11-13 DIAGNOSIS — S6992XA Unspecified injury of left wrist, hand and finger(s), initial encounter: Secondary | ICD-10-CM

## 2015-11-13 DIAGNOSIS — X58XXXA Exposure to other specified factors, initial encounter: Secondary | ICD-10-CM | POA: Insufficient documentation

## 2015-11-13 NOTE — Patient Instructions (Addendum)
Buddy Taping °You have a minor finger or toe injury. It can be managed by buddy taping. Buddy taping means the injured finger or toe is taped to a healthy uninjured adjacent finger or toe. Most minor fractures and dislocations of the smaller fingers and toes will heal in 3 to 4 weeks. Buddy taping immobilizes and protects the area of injury. Buddy taping is not recommended for initial treatment of fractures of the thumb, longer fingers, or the great toe. Buddy taping should not be used for unstable or deformed fractures, but as fracture healing progresses it may be used for protection during rehabilitation. Fractured fingers and toes should be protected by buddy taping as long as the injury is still painful or swollen.  °When an injury is buddy taped, place a small piece of gauze or cotton between the digits that are taped. This helps prevent the skin from breaking down from increased moisture. Buddy taping allows you to get your injury wet when you bathe. Change the gauze and tape more often if it gets wet, and dry the space between the finger or toes. Use a sturdy, hard-soled shoe for better support if you have a fractured toe. In 2 to 3 weeks you can start motion exercises. This will keep the fingers or toes from becoming stiff.  °SEEK IMMEDIATE MEDICAL CARE IF:  °· The injured area becomes cold, numb, or pale. °· You have pain not controlled with medications. °· You notice increasing deformity of the toe or finger. °  °This information is not intended to replace advice given to you by your health care provider. Make sure you discuss any questions you have with your health care provider. °  °Document Released: 03/31/2004 Document Revised: 03/14/2014 Document Reviewed: 07/16/2014 °Elsevier Interactive Patient Education ©2016 Elsevier Inc. ° °

## 2015-11-13 NOTE — Progress Notes (Signed)
Pre visit review using our clinic review tool, if applicable. No additional management support is needed unless otherwise documented below in the visit note. 
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
## 2016-01-11 ENCOUNTER — Other Ambulatory Visit: Payer: Self-pay | Admitting: Family Medicine

## 2016-01-13 LAB — HELICOBACTER PYLORI  SPECIAL ANTIGEN: H. PYLORI Antigen: NOT DETECTED

## 2016-03-04 ENCOUNTER — Other Ambulatory Visit: Payer: Self-pay | Admitting: Family Medicine

## 2016-03-04 ENCOUNTER — Other Ambulatory Visit (INDEPENDENT_AMBULATORY_CARE_PROVIDER_SITE_OTHER): Payer: BLUE CROSS/BLUE SHIELD

## 2016-03-04 ENCOUNTER — Telehealth: Payer: Self-pay | Admitting: Family Medicine

## 2016-03-04 DIAGNOSIS — E559 Vitamin D deficiency, unspecified: Secondary | ICD-10-CM

## 2016-03-04 DIAGNOSIS — R945 Abnormal results of liver function studies: Secondary | ICD-10-CM

## 2016-03-04 DIAGNOSIS — K7689 Other specified diseases of liver: Secondary | ICD-10-CM

## 2016-03-04 LAB — COMPREHENSIVE METABOLIC PANEL
ALBUMIN: 4.5 g/dL (ref 3.5–5.2)
ALK PHOS: 217 U/L — AB (ref 39–117)
ALT: 254 U/L — AB (ref 0–35)
AST: 131 U/L — ABNORMAL HIGH (ref 0–37)
BILIRUBIN TOTAL: 0.4 mg/dL (ref 0.2–1.2)
BUN: 13 mg/dL (ref 6–23)
CO2: 28 mEq/L (ref 19–32)
Calcium: 9.3 mg/dL (ref 8.4–10.5)
Chloride: 103 mEq/L (ref 96–112)
Creatinine, Ser: 0.76 mg/dL (ref 0.40–1.20)
GFR: 83.73 mL/min (ref >60.00)
Glucose, Bld: 143 mg/dL — ABNORMAL HIGH (ref 70–99)
POTASSIUM: 3.6 meq/L (ref 3.5–5.1)
Sodium: 140 mEq/L (ref 135–145)
TOTAL PROTEIN: 7.7 g/dL (ref 6.0–8.3)

## 2016-03-04 LAB — VITAMIN D 25 HYDROXY (VIT D DEFICIENCY, FRACTURES): VITD: 21.52 ng/mL — ABNORMAL LOW (ref 30.00–100.00)

## 2016-03-04 NOTE — Telephone Encounter (Signed)
PCP aware of request.  Did order vitamin D (for D deficiency) and CMP (for abnormal liver functions). Patient aware of order/scheduled lab for this am (03/04/2016).

## 2016-03-04 NOTE — Telephone Encounter (Signed)
Ok to order requested labs?

## 2016-03-04 NOTE — Telephone Encounter (Signed)
Patient called requesting to come in today to get her Vit. D and Liver Enzymes rechecked. She states that she would make an appointment to see Dr. Abner GreenspanBlyth afterwards but since it is the end of the year her insurance will pay for it now. Please advise   Phone: [phone]

## 2016-03-04 NOTE — Telephone Encounter (Signed)
done

## 2016-03-08 ENCOUNTER — Other Ambulatory Visit: Payer: Self-pay | Admitting: Family Medicine

## 2016-03-08 DIAGNOSIS — E559 Vitamin D deficiency, unspecified: Secondary | ICD-10-CM

## 2016-03-08 MED ORDER — VITAMIN D (ERGOCALCIFEROL) 1.25 MG (50000 UNIT) PO CAPS: ORAL_CAPSULE | ORAL | 0 refills | Status: DC

## 2016-03-28 ENCOUNTER — Telehealth: Payer: Self-pay | Admitting: Family Medicine

## 2016-03-28 NOTE — Telephone Encounter (Signed)
Called the patient informed of PCP instructions.  The patient agreed to do.

## 2016-03-28 NOTE — Telephone Encounter (Signed)
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 She is having no energy/chronic fatigue which she says is unlike her.  So she would like a blood test ordered to recheck if she has the Malachi Carlepstein Barr again?  She is scheduled to see you on Thursday 03/31/16 and can discuss.

## 2016-03-28 NOTE — Telephone Encounter (Signed)
The test is not as sensitive once you have tested positive in the past. We can discuss the pluses and minuses of rechecking it at visit.

## 2016-03-31 ENCOUNTER — Ambulatory Visit (INDEPENDENT_AMBULATORY_CARE_PROVIDER_SITE_OTHER): Payer: BLUE CROSS/BLUE SHIELD | Admitting: Family Medicine

## 2016-03-31 ENCOUNTER — Encounter: Payer: Self-pay | Admitting: Family Medicine

## 2016-03-31 VITALS — BP 114/68 | HR 88 | Temp 98.0°F | Wt 203.8 lb

## 2016-03-31 DIAGNOSIS — K5909 Other constipation: Secondary | ICD-10-CM | POA: Diagnosis not present

## 2016-03-31 DIAGNOSIS — R945 Abnormal results of liver function studies: Principal | ICD-10-CM

## 2016-03-31 DIAGNOSIS — Z831 Family history of other infectious and parasitic diseases: Secondary | ICD-10-CM

## 2016-03-31 DIAGNOSIS — R7989 Other specified abnormal findings of blood chemistry: Secondary | ICD-10-CM

## 2016-03-31 DIAGNOSIS — J989 Respiratory disorder, unspecified: Secondary | ICD-10-CM

## 2016-03-31 LAB — HEPATITIS PANEL, ACUTE
HCV AB: NEGATIVE
HEP B S AG: NEGATIVE
Hep A IgM: NONREACTIVE
Hep B C IgM: NONREACTIVE

## 2016-03-31 MED ORDER — ALBUTEROL SULFATE HFA 108 (90 BASE) MCG/ACT IN AERS: 2.0000 | INHALATION_SPRAY | Freq: Four times a day (QID) | RESPIRATORY_TRACT | 1 refills | Status: DC | PRN

## 2016-03-31 NOTE — Patient Instructions (Signed)
Liver Function Tests Liver function tests are blood tests to see how well your liver is working. The proteins and enzymes measured in the test can alert your health care provider to inflammation, damage, or disease in your liver. It is common to have liver function tests:  During annual physical exams.  When you are taking certain medicines.  If you have liver disease.  If you drink a lot of alcohol.  When you are not feeling well.  When you have other conditions that may affect the liver. Substances measured may include:  Alanine transaminase (ALT). This is an enzyme in the liver.  Aspartate transaminase (AST). This is an enzyme in the liver, heart, and muscles.  Alkaline phosphatase (ALP). This is a protein in the liver, bile ducts, and bone. It is also in other body tissues.  Total bilirubin. This is a yellow pigment in bile.  Albumin. This is a protein in the liver.  Prothrombin time and international normalized ratio (PT and INR). PT measures the time that it takes for your blood to clot. INR is a calculation of blood clotting time based upon your PT result. It is also calculated based on normal ranges defined by the laboratory that processed your lab test.  Total protein. This measures two proteins, albumin and globulin, found in the blood. How do I prepare for this test? How you prepare will depend on which tests are being done and the reason why these tests are being done. You may need to:  Avoid eating for 4-6 hours before the test or as directed by your health care provider.  Stop taking certain medicines prior to your blood test as directed by your health care provider. What do the results mean? It is your responsibility to obtain your test results. Ask the lab or department performing the test when and how you will get your results. Contact your health care provider to discuss any questions you have about your results. RANGE OF NORMAL VALUES  Ranges for normal values  may vary among different labs and hospitals. You should always check with your health care provider after having lab work or other tests done to discuss the meaning of your test results and whether your values are considered within normal limits. The following are normal ranges for substances measured in liver function tests: ALT   Infant: may be twice as high as adult values.  Child or adult: 4-36 international units/L at 37C or 4-36 units/L (SI units).  Elderly: may be slightly higher than adult values. AST   Newborn 0-5 days old: 35-140 units/L.  Child under 3 years old: 15-60 units/L.  3-6 years old: 15-50 units/L.  6-12 years old: 10-50 units/L.  12-18 years old: 10-40 units/L.  Adult: 0-35 units/L or 0-0.58 microkatal/L (SI units).  Elderly: slightly higher than adults. ALP   Child under 2 years old: 85-235 units/L.  2-8 years old: 65-210 units/L.  9-15 years old: 60-300 units/L.  16-21 years old: 30-200 units/L.    Elderly: slightly higher than adult. Total bilirubin   Newborn: 1.0-12.0 mg/dL or 17.1-205 micromoles/L (SI units).  Adult, elderly, or child: 0.3-1.0 mg/dL or 5.1-17 micromoles/L. Albumin   Premature infant: 3.0-4.2 g/dL.  Newborn: 3.5-5.4 g/dL.  Infant: 4.4-5.4 g/dL.  Child: 4.0-5.9 g/dL.  Adult or elderly: 3.5-5.0 g/dL or 35-50 g/L (SI units). PT   11.0-12.5 seconds; 85%-100%. INR   0.8-1.1. Total protein   Premature infant: 4.2-7.6 g/dL.  Newborn: 4.6-7.4 g/dL.  Infant: 6.0-6.7 g/dL.  Child:   6.2-8.0 g/dL.  Adult or elderly: 6.4-8.3 g/dL or 64-83 g/L (SI units). MEANING OF RESULTS OUTSIDE NORMAL VALUE RANGES  Sometimes test results can be abnormal due to other factors, such as medicines, exercise, or pregnancy. Follow up with your health care provider if you have any questions about test results outside the normal value ranges. ALT   Levels above the normal range, along with other test results, may indicate liver  disease. AST   Levels above the normal range, along with other test results, may indicate liver disease. Sometimes levels also increase after burns, surgery, heart attack, muscle damage, or seizure. ALP   Levels above the normal range, along with other test results, may indicate biliary obstruction, diseases of the liver, bone disease, thyroid disease, tumors, fractures, leukemia or lymphoma, or several other conditions. People with blood type O or B may show higher levels after a fatty meal.  Levels below the normal range, along with other test results, may indicate bone and teeth conditions, malnutrition, protein deficiency, or Wilson disease. Total bilirubin   Levels above the normal range, along with other test results, may indicate problems with the liver, gallbladder, or bile ducts. Albumin   Levels above the normal range, along with other test results, may indicate dehydration. They may also be caused by a diet that is high in protein. Sometimes, the band placed around the upper arm during the process of drawing blood can cause the level of this protein in your blood to rise and give you a result above the normal range.  Levels below the normal range, along with other tests results, may indicate kidney disease, liver disease, or malabsorption of nutrients. PT and INR   Levels above the normal range mean your blood is clotting slower than normal. This may be due to blood disorders, liver disorders, or low levels of vitamin K. Total protein   Levels above the normal range, along with other test results, may be due to infection or other diseases.  Levels below the normal range, along with other test results, may be due to an immune system disorder, bleeding, burns, kidney disorder, liver disease, trouble absorbing or getting enough nutrients, or other conditions that affect the intestines. Talk with your health care provider to discuss your results, treatment options, and if necessary,  the need for more tests. Talk with your health care provider if you have any questions about your results. This information is not intended to replace advice given to you by your health care provider. Make sure you discuss any questions you have with your health care provider. Document Released: 03/26/2004 Document Revised: 10/28/2015 Document Reviewed: 06/27/2013 Elsevier Interactive Patient Education  2017 Elsevier Inc.  

## 2016-03-31 NOTE — Progress Notes (Signed)
Subjective:    Patient ID: Hailey Cochran, female    DOB: 10/06/1960, 56 y.o.   MRN: [id number]  Chief Complaint  Patient presents with  . Labwork Concerns    HPI Patient is in today for an acute visit. Patient is complaining of stomach pains. Isn't sure if it is due to constipation or liver concerns. Has had issues with hpylori. No additional concerns notes. She notes her bowels are moving slowly at times. Can go several days without a bowel movement. No bloody or tarry stool. No nausea, vomiting or anorexia. Was notably congested but that has improved. Denies CP/palp/SOB/HA/fevers or GU c/o. Taking meds as prescribed  I acted as a Education administrator for Dr. Charlett Blake. Raiford Noble, [place]   Past Medical History:  Diagnosis Date  . Anemia   . Cervical cancer screening 02/02/2015   Menarche at 6, stop at 7.5, restarted at 10 Regular and heavy flow and painful from endocmetriosis No history of abnormal pap in past G5P2, s/p 3 miscarriages, 2 svd No history of abnormal MGM, never had a MGM, no concerns, nursed 8 kids, declines MGM for now No concerns today  gyn surgeries: right oophorectomy for an endometrioma LMP at age 63  . Elevated antinuclear antibody (ANA) level   . Elevated liver enzymes   . Endometriosis   . Fibromyalgia   . Glaucoma   . History of endometriosis   . Lumbar herniated disc   . Overweight 09/29/2014  . Premature ovarian failure    30s  . Preventative health care 02/07/2015  . 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 Vitamin B 12 deficiency     Past Surgical History:  Procedure Laterality Date  . SALPINGOOPHORECTOMY Right 1990   endometriosis    Family History  Problem Relation Age of Onset  . Dementia Mother     "lewey body"  . Leukemia Mother   . Diabetes Mother     diet controlled  . Hypertension Mother     diet controlled  . Parkinson's disease Mother   . Hepatitis C Father      2 strains  . Cancer Father     hepatocellular carcinoma  .  Hypertension Father   . Bullous pemphigoid Father   . Alcohol abuse Father     drug abuse  . Muscular dystrophy Paternal Uncle   . Other Sister     fatty liver disease  . Rickets Son   . Gallstones Maternal Aunt   . Anemia Maternal Aunt   . Diabetes Maternal Grandmother   . Dementia Maternal Grandmother   . Gallstones Maternal Grandfather   . Heart disease Maternal Grandfather     cardiomegaly  . Alzheimer's disease Paternal Grandfather   . Hypertension Paternal Grandfather   . Single kidney Daughter   . Eczema Son     Social History   Social History  . Marital status: Married    Spouse name: N/A  . Number of children: 8  . 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 Smoking status: Never Smoker  . Smokeless tobacco: Never Used  . Alcohol use No  . Drug use: No  . Sexual activity: Yes     Comment: lives with children, husband, sister in law and GM, no dietary restrictions avoids pork and alcohol.   Other Topics Concern  . Not on file   Social History Narrative   2 biological  children 62- daughter [name]                                    10- [name]   3 miscarraiges       Adopted children:   59 year old- [name]   16 year old amelia   56 year old evan michael   7 yosiah   6 jeshua   6 kaleb daniel      Married- Samuel   Homeschools her children   Enjoys Corporate investment banker, Barrister's clerk, dancing   Christian, jewish heritage    Outpatient Medications Prior to Visit  Medication Sig Dispense Refill  . EPINEPHrine 0.3 mg/0.3 mL IJ SOAJ injection Inject 0.3 mLs (0.3 mg total) into the muscle as needed. 2 Device 1  . L-THEANINE PO Take by mouth daily.    . TURMERIC CURCUMIN PO Take by mouth daily.    . Vitamin D, Ergocalciferol, (DRISDOL) 50000 units CAPS capsule TAKE 1 CAPSULE BY MOUTH EVERY 7 DAYS 12 capsule 0   No facility-administered medications prior to visit.     Allergies  Allergen Reactions   . Morphine   . Other     ANTS.  Causes numbness / paralysis per pt  . Wasp Venom Other (See Comments)    Numbness / paralysis    Review of Systems  Constitutional: Negative for fever and malaise/fatigue.  HENT: Positive for congestion. Negative for sore throat.   Eyes: Negative for blurred vision.  Respiratory: Negative for cough and shortness of breath.   Cardiovascular: Negative for chest pain, palpitations and leg swelling.  Gastrointestinal: Positive for abdominal pain and constipation. Negative for vomiting.  Musculoskeletal: Negative for back pain.  Skin: Negative for rash.  Neurological: Negative for loss of consciousness and headaches.       Objective:    Physical Exam  Constitutional: She is oriented to person, place, and time. She appears well-developed and well-nourished. No distress.  HENT:  Head: Normocephalic and atraumatic.  Eyes: Conjunctivae are normal.  Neck: Normal range of motion. No thyromegaly present.  Cardiovascular: Normal rate and regular rhythm.   Pulmonary/Chest: Effort normal and breath sounds normal. She has no wheezes.  Abdominal: Soft. Bowel sounds are normal. There is no tenderness.  Musculoskeletal: She exhibits no edema or deformity.  Lymphadenopathy:    She has no cervical adenopathy.  Neurological: She is alert and oriented to person, place, and time.  Skin: Skin is warm and dry. She is not diaphoretic.  Psychiatric: She has a normal mood and affect.    BP 114/68 (BP Location: Left Arm, Patient Position: Sitting, Cuff Size: Normal)   Pulse 88   Temp 98 F (36.7 C) (Oral)   Wt 203 lb 12.8 oz (92.4 kg)   SpO2 99% Comment: RA  BMI 28.42 kg/m  Wt Readings from Last 3 Encounters:  03/31/16 203 lb 12.8 oz (92.4 kg)  11/13/15 200 lb 9.6 oz (91 kg)  06/04/15 202 lb 6.4 oz (91.8 kg)     Lab Results  Component Value Date   WBC 8.9 03/31/2016   HGB 13.5 03/31/2016   HCT 39.1 03/31/2016   PLT 246.0 03/31/2016   GLUCOSE 106 (H)  03/31/2016   CHOL 189 02/02/2015   TRIG 151.0 (H) 02/02/2015   HDL 49.90 02/02/2015   LDLCALC 109 (H) 02/02/2015   ALT 64 (H) 03/31/2016   AST 36 03/31/2016   NA 139 03/31/2016  K 3.8 03/31/2016   CL 106 03/31/2016   CREATININE 0.75 03/31/2016   BUN 13 03/31/2016   CO2 27 03/31/2016   TSH 4.02 02/02/2015    Lab Results  Component Value Date   TSH 4.02 02/02/2015   Lab Results  Component Value Date   WBC 8.9 03/31/2016   HGB 13.5 03/31/2016   HCT 39.1 03/31/2016   MCV 92.0 03/31/2016   PLT 246.0 03/31/2016   Lab Results  Component Value Date   NA 139 03/31/2016   K 3.8 03/31/2016   CO2 27 03/31/2016   GLUCOSE 106 (H) 03/31/2016   BUN 13 03/31/2016   CREATININE 0.75 03/31/2016   BILITOT 0.3 03/31/2016   ALKPHOS 142 (H) 03/31/2016   AST 36 03/31/2016   ALT 64 (H) 03/31/2016   PROT 7.7 03/31/2016   ALBUMIN 4.4 03/31/2016   CALCIUM 9.4 03/31/2016   GFR 85.00 03/31/2016   Lab Results  Component Value Date   CHOL 189 02/02/2015   Lab Results  Component Value Date   HDL 49.90 02/02/2015   Lab Results  Component Value Date   LDLCALC 109 (H) 02/02/2015   Lab Results  Component Value Date   TRIG 151.0 (H) 02/02/2015   Lab Results  Component Value Date   CHOLHDL 4 02/02/2015   No results found for: HGBA1C     Assessment & Plan:   Problem List Items Addressed This Visit    Elevated liver function tests - Primary    With a family history of a father with Hep C. Her hepatitis testing was negative but she is going go follow up with hepatology      Relevant Orders   Epstein-Barr virus VCA, IgM (Completed)   CMV IgM (Completed)   RSV(respiratory syncytial virus) ab, bld (Completed)   Sed Rate (ESR) (Completed)   CBC w/Diff (Completed)   Hepatitis, Acute (Completed)   Other constipation    Encouraged increased hydration and fiber in diet. Daily probiotics. If bowels not moving can use MOM 2 tbls po in 4 oz of warm prune juice by mouth as needed.         Relevant Orders   Epstein-Barr virus VCA, IgM (Completed)   CMV IgM (Completed)   Comprehensive metabolic panel (Completed)   Sed Rate (ESR) (Completed)   CBC w/Diff (Completed)   Hepatitis, Acute (Completed)   Family history of hepatitis C   Relevant Orders   Hepatitis, Acute (Completed)   Respiratory illness    Encouraged increased rest and hydration, add probiotics, zinc such as Coldeze or Xicam. Treat fevers as needed. Elderberry liquid, Vitamin C. Is improving but if worsens should let us know.      Relevant Orders   Epstein-Barr virus VCA, IgM (Completed)   CMV IgM (Completed)   RSV(respiratory syncytial virus) ab, bld (Completed)   Sed Rate (ESR) (Completed)   CBC w/Diff (Completed)      I am having Ms. Kniseley start on albuterol. I am also having her maintain her EPINEPHrine, L-THEANINE PO, TURMERIC CURCUMIN PO, and Vitamin D (Ergocalciferol).  Meds ordered this encounter  Medications  . albuterol (PROVENTIL HFA;VENTOLIN HFA) 108 (90 Base) MCG/ACT inhaler    Sig: Inhale 2 puffs into the lungs every 6 (six) hours as needed for wheezing or shortness of breath.    Dispense:  1 Inhaler    Refill:  1    CMA served as scribe during this visit. History, Physical and Plan performed by medical provider. Documentation and orders  reviewed and attested to.  Penni Homans, MD

## 2016-03-31 NOTE — Progress Notes (Signed)
Patient ID: Hailey Cochran, female   DOB: November 04, 1960, 56 y.o.   MRN: [id number]

## 2016-03-31 NOTE — Progress Notes (Signed)
Pre visit review using our clinic review tool, if applicable. No additional management support is needed unless otherwise documented below in the visit note. 

## 2016-04-01 LAB — CBC WITH DIFFERENTIAL/PLATELET
BASOS PCT: 0.9 % (ref 0.0–3.0)
Basophils Absolute: 0.1 10*3/uL (ref 0.0–0.1)
Eosinophils Absolute: 0.3 10*3/uL (ref 0.0–0.7)
Eosinophils Relative: 3.9 % (ref 0.0–5.0)
HEMATOCRIT: 39.1 % (ref 36.0–46.0)
HEMOGLOBIN: 13.5 g/dL (ref 12.0–15.0)
LYMPHS PCT: 25 % (ref 12.0–46.0)
Lymphs Abs: 2.2 10*3/uL (ref 0.7–4.0)
MCHC: 34.6 g/dL (ref 30.0–36.0)
MCV: 92 fl (ref 78.0–100.0)
MONOS PCT: 7 % (ref 3.0–12.0)
Monocytes Absolute: 0.6 10*3/uL (ref 0.1–1.0)
NEUTROS ABS: 5.7 10*3/uL (ref 1.4–7.7)
Neutrophils Relative %: 63.2 % (ref 43.0–77.0)
PLATELETS: 246 10*3/uL (ref 150.0–400.0)
RBC: 4.26 Mil/uL (ref 3.87–5.11)
RDW: 12.7 % (ref 11.5–15.5)
WBC: 8.9 10*3/uL (ref 4.0–10.5)

## 2016-04-01 LAB — COMPREHENSIVE METABOLIC PANEL
ALT: 64 U/L — ABNORMAL HIGH (ref 0–35)
AST: 36 U/L (ref 0–37)
Albumin: 4.4 g/dL (ref 3.5–5.2)
Alkaline Phosphatase: 142 U/L — ABNORMAL HIGH (ref 39–117)
BUN: 13 mg/dL (ref 6–23)
CALCIUM: 9.4 mg/dL (ref 8.4–10.5)
CHLORIDE: 106 meq/L (ref 96–112)
CO2: 27 meq/L (ref 19–32)
Creatinine, Ser: 0.75 mg/dL (ref 0.40–1.20)
GFR: 85 mL/min (ref >60.00)
GLUCOSE: 106 mg/dL — AB (ref 70–99)
POTASSIUM: 3.8 meq/L (ref 3.5–5.1)
Sodium: 139 mEq/L (ref 135–145)
Total Bilirubin: 0.3 mg/dL (ref 0.2–1.2)
Total Protein: 7.7 g/dL (ref 6.0–8.3)

## 2016-04-01 LAB — CMV IGM

## 2016-04-01 LAB — EPSTEIN-BARR VIRUS VCA, IGM

## 2016-04-01 LAB — SEDIMENTATION RATE: Sed Rate: 22 mm/hr (ref 0–30)

## 2016-04-03 DIAGNOSIS — Z831 Family history of other infectious and parasitic diseases: Secondary | ICD-10-CM | POA: Insufficient documentation

## 2016-04-03 DIAGNOSIS — J989 Respiratory disorder, unspecified: Secondary | ICD-10-CM | POA: Insufficient documentation

## 2016-04-03 DIAGNOSIS — K5909 Other constipation: Secondary | ICD-10-CM | POA: Insufficient documentation

## 2016-04-03 HISTORY — DX: Other constipation: K59.09

## 2016-04-03 HISTORY — DX: Family history of other infectious and parasitic diseases: Z83.1

## 2016-04-03 NOTE — Assessment & Plan Note (Signed)
With a family history of a father with Hep C. Her hepatitis testing was negative but she is going go follow up with hepatology

## 2016-04-03 NOTE — Assessment & Plan Note (Signed)
Encouraged increased rest and hydration, add probiotics, zinc such as Coldeze or Xicam. Treat fevers as needed. Elderberry liquid, Vitamin C. Is improving but if worsens should let us know.

## 2016-04-03 NOTE — Assessment & Plan Note (Signed)
Encouraged increased hydration and fiber in diet. Daily probiotics. If bowels not moving can use MOM 2 tbls po in 4 oz of warm prune juice by mouth as needed.

## 2016-04-07 LAB — RSV(RESPIRATORY SYNCYTIAL VIRUS) AB, BLOOD: RSV Antibodies: <1:8 {titer}

## 2016-04-25 ENCOUNTER — Other Ambulatory Visit: Payer: Self-pay | Admitting: Gastroenterology

## 2016-04-25 DIAGNOSIS — R7989 Other specified abnormal findings of blood chemistry: Secondary | ICD-10-CM

## 2016-04-25 DIAGNOSIS — R945 Abnormal results of liver function studies: Principal | ICD-10-CM

## 2016-05-03 ENCOUNTER — Ambulatory Visit
Admission: RE | Admit: 2016-05-03 | Discharge: 2016-05-03 | Disposition: A | Discharge status: Home / Self Care | Payer: BLUE CROSS/BLUE SHIELD | Source: Ambulatory Visit | Attending: Gastroenterology | Admitting: Gastroenterology

## 2016-05-03 DIAGNOSIS — R945 Abnormal results of liver function studies: Principal | ICD-10-CM

## 2016-05-03 DIAGNOSIS — R7989 Other specified abnormal findings of blood chemistry: Secondary | ICD-10-CM

## 2016-05-27 ENCOUNTER — Telehealth: Payer: Self-pay | Admitting: Family Medicine

## 2016-05-27 NOTE — Telephone Encounter (Signed)
[facility] - Client TELEPHONE ADVICE RECORD [facility]  Patient Name: Hailey Cochran  DOB: Jul 22, 1960    Initial Comment Caller says she made an appt for Monday morning. Says she is having an irregular heart beat. Says it's happened before but she feels like it's happening more frequently.   Nurse Assessment  Nurse: Laural BenesJohnson, RN, Dondra SpryGail Date/Time Lamount Cohen(Eastern Time): 05/27/2016 3:47:14 PM  Confirm and document reason for call. If symptomatic, describe symptoms. ---Dahlia ClientHannah had this for years and she is having irregular heart beat more frequently-- exercising more has asthma daily  Does the patient have any new or worsening symptoms? ---Yes  Will a triage be completed? ---Yes  Related visit to physician within the last 2 weeks? ---No  Does the PT have any chronic conditions? (i.e. diabetes, asthma, etc.) ---No  Is this a behavioral health or substance abuse call? ---No     Guidelines    Guideline Title Affirmed Question Affirmed Notes  Heart Rate and Heartbeat Questions Palpitations (all triage questions negative)    Final Disposition User   Home Care Laural BenesJohnson, RN, Dondra SpryGail    Referrals  REFERRED TO PCP OFFICE   Disagree/Comply: Comply

## 2016-05-30 ENCOUNTER — Other Ambulatory Visit (INDEPENDENT_AMBULATORY_CARE_PROVIDER_SITE_OTHER): Payer: BLUE CROSS/BLUE SHIELD

## 2016-05-30 ENCOUNTER — Ambulatory Visit (INDEPENDENT_AMBULATORY_CARE_PROVIDER_SITE_OTHER): Payer: BLUE CROSS/BLUE SHIELD | Admitting: Family

## 2016-05-30 ENCOUNTER — Encounter: Payer: Self-pay | Admitting: Family

## 2016-05-30 VITALS — BP 100/62 | HR 78 | Temp 98.1°F | Resp 16 | Ht 71.0 in

## 2016-05-30 DIAGNOSIS — R05 Cough: Secondary | ICD-10-CM

## 2016-05-30 DIAGNOSIS — R739 Hyperglycemia, unspecified: Secondary | ICD-10-CM

## 2016-05-30 DIAGNOSIS — R002 Palpitations: Secondary | ICD-10-CM | POA: Diagnosis not present

## 2016-05-30 DIAGNOSIS — R059 Cough, unspecified: Secondary | ICD-10-CM

## 2016-05-30 LAB — COMPREHENSIVE METABOLIC PANEL
ALT: 71 U/L — AB (ref 0–35)
AST: 43 U/L — AB (ref 0–37)
Albumin: 4.2 g/dL (ref 3.5–5.2)
Alkaline Phosphatase: 132 U/L — ABNORMAL HIGH (ref 39–117)
BUN: 12 mg/dL (ref 6–23)
CHLORIDE: 106 meq/L (ref 96–112)
CO2: 28 meq/L (ref 19–32)
Calcium: 9.1 mg/dL (ref 8.4–10.5)
Creatinine, Ser: 0.71 mg/dL (ref 0.40–1.20)
GFR: 90.49 mL/min (ref >60.00)
Glucose, Bld: 118 mg/dL — ABNORMAL HIGH (ref 70–99)
POTASSIUM: 3.8 meq/L (ref 3.5–5.1)
SODIUM: 140 meq/L (ref 135–145)
TOTAL PROTEIN: 7.3 g/dL (ref 6.0–8.3)
Total Bilirubin: 0.3 mg/dL (ref 0.2–1.2)

## 2016-05-30 LAB — CBC WITH DIFFERENTIAL/PLATELET
BASOS ABS: 0 10*3/uL (ref 0.0–0.1)
Basophils Relative: 0.7 % (ref 0.0–3.0)
EOS ABS: 0.2 10*3/uL (ref 0.0–0.7)
Eosinophils Relative: 3.6 % (ref 0.0–5.0)
HEMATOCRIT: 42.1 % (ref 36.0–46.0)
HEMOGLOBIN: 14.1 g/dL (ref 12.0–15.0)
LYMPHS PCT: 25.9 % (ref 12.0–46.0)
Lymphs Abs: 1.7 10*3/uL (ref 0.7–4.0)
MCHC: 33.6 g/dL (ref 30.0–36.0)
MCV: 94.7 fl (ref 78.0–100.0)
MONOS PCT: 6.8 % (ref 3.0–12.0)
Monocytes Absolute: 0.4 10*3/uL (ref 0.1–1.0)
NEUTROS ABS: 4.1 10*3/uL (ref 1.4–7.7)
Neutrophils Relative %: 63 % (ref 43.0–77.0)
Platelets: 243 10*3/uL (ref 150.0–400.0)
RBC: 4.45 Mil/uL (ref 3.87–5.11)
RDW: 12.8 % (ref 11.5–15.5)
WBC: 6.5 10*3/uL (ref 4.0–10.5)

## 2016-05-30 LAB — TSH: TSH: 2.77 u[IU]/mL (ref 0.35–4.50)

## 2016-05-30 LAB — HEMOGLOBIN A1C: Hgb A1c MFr Bld: 5.7 % (ref 4.6–6.5)

## 2016-05-30 NOTE — Progress Notes (Signed)
Pre visit review using our clinic review tool, if applicable. No additional management support is needed unless otherwise documented below in the visit note. 

## 2016-05-30 NOTE — Progress Notes (Signed)
Subjective:    Patient ID: Hailey Cochran, female    DOB: 08-15-1960, 56 y.o.   MRN: [id number]  HPI  Ms. Hailey Cochran is a 56 yr old female who presents today with two concerns:  1) Palpitations- reports that she has had intermittent palpitations for years but that symptoms have become more frequent recently. She describes a brief flutter.  Recently started walking and doing some aerobics and band work for strengthening.  She has been having some asthma with exercise and has been using albuterol prior to exercise. She reports that she has not noticed if her palpitations have worsened with albuterol. Palpitations do not happen every day.  She reports that she started drinking 1 cup of coffee a day about 3 weeks ago.   2) Cough-  Noted improvement in her cough on omeprazole. She reports that she was told that she "does not make stomach acid."  Reports that  When she took PPI her cough got better. She reports that her epigastric discomfort that she developed prior to H pylori treatment. Notes that she has a mild cough at this time.   Review of Systems See HPI  Past Medical History:  Diagnosis Date  . Anemia   . Cervical cancer screening 02/02/2015   Menarche at 6, stop at 7.5, restarted at 10 Regular and heavy flow and painful from endocmetriosis No history of abnormal pap in past G5P2, s/p 3 miscarriages, 2 svd No history of abnormal MGM, never had a MGM, no concerns, nursed 8 kids, declines MGM for now No concerns today  gyn surgeries: right oophorectomy for an endometrioma LMP at age 56  . Elevated antinuclear antibody (ANA) level   . Elevated liver enzymes   . Endometriosis   . Fibromyalgia   . Glaucoma   . History of endometriosis   . Lumbar herniated disc   . Overweight 09/29/2014  . Premature ovarian failure    30s  . Preventative health care 02/07/2015  . 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 Vitamin B 12 deficiency      Social History   Social History  .  Marital status: Married    Spouse name: N/A  . Number of children: 8  . 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 Smoking status: Never Smoker  . Smokeless tobacco: Never Used  . Alcohol use No  . Drug use: No  . Sexual activity: Yes     Comment: lives with children, husband, sister in law and GM, no dietary restrictions avoids pork and alcohol.   Other Topics Concern  . Not on file   Social History Narrative   2 biological children [id number]- daughter Hailey Cochran                                    [id number]- Hailey Cochran   3 miscarraiges       Adopted children:   56 year old- Hailey Cochran   56 year old Hailey Cochran   56 year old Hailey Cochran   7 Hailey Cochran   6 Hailey Cochran   6 Hailey Cochran      Married- Hailey Cochran   Homeschools her children   Enjoys Psychologist, forensicbible journaling, Air traffic controllerworshiping, dancing   Christian, jewish heritage    Past Surgical History:  Procedure Laterality Date  . SALPINGOOPHORECTOMY Right 1990   endometriosis  Family History  Problem Relation Age of Onset  . Dementia Mother     "lewey body"  . Leukemia Mother   . Diabetes Mother     diet controlled  . Hypertension Mother     diet controlled  . Parkinson's disease Mother   . Hepatitis C Father      2 strains  . Cancer Father     hepatocellular carcinoma  . Hypertension Father   . Bullous pemphigoid Father   . Alcohol abuse Father     drug abuse  . Muscular dystrophy Paternal Uncle   . Other Sister     fatty liver disease  . Rickets Son   . Gallstones Maternal Aunt   . Anemia Maternal Aunt   . Diabetes Maternal Grandmother   . Dementia Maternal Grandmother   . Gallstones Maternal Grandfather   . Heart disease Maternal Grandfather     cardiomegaly  . Alzheimer's disease Paternal Grandfather   . Hypertension Paternal Grandfather   . Single kidney Daughter   . Eczema Son     Allergies  Allergen Reactions  . Morphine   . Other     ANTS.  Causes numbness / paralysis per  pt  . Wasp Venom Other (See Comments)    Numbness / paralysis    Current Outpatient Prescriptions on File Prior to Visit  Medication Sig Dispense Refill  . albuterol (PROVENTIL HFA;VENTOLIN HFA) 108 (90 Base) MCG/ACT inhaler Inhale 2 puffs into the lungs every 6 (six) hours as needed for wheezing or shortness of breath. 1 Inhaler 1  . EPINEPHrine 0.3 mg/0.3 mL IJ SOAJ injection Inject 0.3 mLs (0.3 mg total) into the muscle as needed. 2 Device 1  . L-THEANINE PO Take by mouth daily as needed.     . TURMERIC CURCUMIN PO Take by mouth daily.    . Vitamin D, Ergocalciferol, (DRISDOL) 50000 units CAPS capsule TAKE 1 CAPSULE BY MOUTH EVERY 7 DAYS 12 capsule 0   No current facility-administered medications on file prior to visit.     BP 100/62 (BP Location: Right Arm, Cuff Size: Normal) Comment (BP Location): manual  Pulse 78   Temp 98.1 F (36.7 C) (Oral)   Resp 16   Ht 5\' 11"  (1.803 m)   SpO2 100% Comment: room air      Objective:   Physical Exam  Constitutional: She appears well-developed and well-nourished.  HENT:  Head: Normocephalic and atraumatic.  Mouth/Throat: No posterior oropharyngeal edema or posterior oropharyngeal erythema.  Cardiovascular: Normal rate, regular rhythm and normal heart sounds.   No murmur heard. Pulmonary/Chest: Effort normal and breath sounds normal. No respiratory distress. She has no wheezes.  Psychiatric: She has a normal mood and affect. Her behavior is normal. Judgment and thought content normal.          Assessment & Plan:  Palpitations- Suspect intermittent PVC's. EKG tracing is personally reviewed.  EKG notes NSR.  No acute changes.  Will obtain 24 hour holter, CBC, electrolytes, TSH.  Discussed limiting caffeine consumption.  She is interested in continuing her exercise.  I think this is reasonable. Advised pt to slow down if she develops palpitations during exercise. We also discussed that albuterol can increase her heart rate.  Cough-  mild. I don't think her symptoms are severe enough to warrant continued PPI therapy. Mild allergy symptoms may also be a contributor. Monitor.

## 2016-05-30 NOTE — Patient Instructions (Addendum)
Please complete lab work prior to leaving. You will be contacted about your referral for your Holter Monitor. Call if symptoms become more frequent.

## 2016-06-28 ENCOUNTER — Ambulatory Visit (INDEPENDENT_AMBULATORY_CARE_PROVIDER_SITE_OTHER): Payer: BLUE CROSS/BLUE SHIELD

## 2016-06-28 DIAGNOSIS — R002 Palpitations: Secondary | ICD-10-CM | POA: Diagnosis not present

## 2016-07-12 ENCOUNTER — Encounter: Payer: Self-pay | Admitting: Family Medicine

## 2016-07-12 ENCOUNTER — Ambulatory Visit (INDEPENDENT_AMBULATORY_CARE_PROVIDER_SITE_OTHER): Payer: BLUE CROSS/BLUE SHIELD | Admitting: Family Medicine

## 2016-07-12 VITALS — BP 110/60 | HR 70 | Temp 98.9°F | Resp 18 | Ht 71.0 in | Wt 186.6 lb

## 2016-07-12 DIAGNOSIS — R945 Abnormal results of liver function studies: Secondary | ICD-10-CM

## 2016-07-12 DIAGNOSIS — R002 Palpitations: Secondary | ICD-10-CM | POA: Diagnosis not present

## 2016-07-12 DIAGNOSIS — E663 Overweight: Secondary | ICD-10-CM

## 2016-07-12 DIAGNOSIS — R7989 Other specified abnormal findings of blood chemistry: Secondary | ICD-10-CM | POA: Diagnosis not present

## 2016-07-12 DIAGNOSIS — R0602 Shortness of breath: Secondary | ICD-10-CM

## 2016-07-12 DIAGNOSIS — E559 Vitamin D deficiency, unspecified: Secondary | ICD-10-CM | POA: Diagnosis not present

## 2016-07-12 DIAGNOSIS — Z124 Encounter for screening for malignant neoplasm of cervix: Secondary | ICD-10-CM

## 2016-07-12 HISTORY — DX: Palpitations: R00.2

## 2016-07-12 NOTE — Progress Notes (Signed)
Subjective:  I acted as a Neurosurgeonscribe for Dr. Abner GreenspanBlyth. Denishia Citro, [place]   Patient ID: Hailey AceHannah Kniseley, female    DOB: 1960/08/10, 56 y.o.   MRN: [id number]  Chief Complaint  Patient presents with  . Follow-up    HPI  Patient is in today for a follow up on her heart monitor results. She states she has been doing weight loss regiments and has currently lost 20lbs. patient has no acute concerns. No recent febrile illness or acute concerns. Encouraged moist heat and gentle stretching as tolerated. May try NSAIDs and prescription meds as directed and report if symptoms worsen or seek immediate care  Patient Care Team: Bradd CanaryBlyth, Stacey A, MD as PCP - General (Family Medicine) Monica Bectonhekkekandam, Thomas J, MD as Consulting Physician (Sports Medicine)   Past Medical History:  Diagnosis Date  . Anemia   . Cervical cancer screening 02/02/2015   Menarche at 6, stop at 7.5, restarted at 10 Regular and heavy flow and painful from endocmetriosis No history of abnormal pap in past G5P2, s/p 3 miscarriages, 2 svd No history of abnormal MGM, never had a MGM, no concerns, nursed 8 kids, declines MGM for now No concerns today  gyn surgeries: right oophorectomy for an endometrioma LMP at age 56  . Elevated antinuclear antibody (ANA) level   . Elevated liver enzymes   . Endometriosis   . Fibromyalgia   . Glaucoma   . History of endometriosis   . Lumbar herniated disc   . Overweight 09/29/2014  . Premature ovarian failure    30s  . Preventative health care 02/07/2015  . 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 Vitamin B 12 deficiency     Past Surgical History:  Procedure Laterality Date  . SALPINGOOPHORECTOMY Right 1990   endometriosis    Family History  Problem Relation Age of Onset  . Dementia Mother     "lewey body"  . Leukemia Mother   . Diabetes Mother     diet controlled  . Hypertension Mother     diet controlled  . Parkinson's disease Mother   . Hepatitis C Father      2  strains  . Cancer Father     hepatocellular carcinoma  . Hypertension Father   . Bullous pemphigoid Father   . Alcohol abuse Father     drug abuse  . Muscular dystrophy Paternal Uncle   . Other Sister     fatty liver disease  . Rickets Son   . Gallstones Maternal Aunt   . Anemia Maternal Aunt   . Diabetes Maternal Grandmother   . Dementia Maternal Grandmother   . Gallstones Maternal Grandfather   . Heart disease Maternal Grandfather     cardiomegaly  . Alzheimer's disease Paternal Grandfather   . Hypertension Paternal Grandfather   . Single kidney Daughter   . Eczema Son     Social History   Social History  . Marital status: Married    Spouse name: N/A  . Number of children: 8  . 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 Smoking status: Never Smoker  . Smokeless tobacco: Never Used  . Alcohol use No  . Drug use: No  . Sexual activity: Yes     Comment: lives with children, husband, sister in law and GM, no dietary restrictions avoids pork and alcohol.   Other Topics Concern  . Not on file  Social History Narrative   2 biological children 12- daughter [name]                                    43- [name]   3 miscarraiges       Adopted children:   90 year old- [name]   49 year old amelia   56 year old evan michael   7 yosiah   6 jeshua   6 kaleb daniel      Married- Remi Deter   Homeschools her children   Enjoys Psychologist, forensic, worshiping, dancing   Christian, jewish heritage    Outpatient Medications Prior to Visit  Medication Sig Dispense Refill  . albuterol (PROVENTIL HFA;VENTOLIN HFA) 108 (90 Base) MCG/ACT inhaler Inhale 2 puffs into the lungs every 6 (six) hours as needed for wheezing or shortness of breath. 1 Inhaler 1  . ALPHA LIPOIC ACID PO Take 1 capsule by mouth daily.    Marland Kitchen EPINEPHrine 0.3 mg/0.3 mL IJ SOAJ injection Inject 0.3 mLs (0.3 mg total) into the muscle as needed. 2 Device 1   . L-THEANINE PO Take by mouth daily as needed.     Marland Kitchen MILK THISTLE PO Take 1 capsule by mouth daily.    Marland Kitchen OVER THE COUNTER MEDICATION NAC. Take 2 capsules daily    . OVER THE COUNTER MEDICATION NATO KINASE.  Take 2 capsules daily    . SELENIUM PO Take 1 capsule by mouth daily.    . TURMERIC CURCUMIN PO Take by mouth daily.    . Vitamin D, Ergocalciferol, (DRISDOL) 50000 units CAPS capsule TAKE 1 CAPSULE BY MOUTH EVERY 7 DAYS 12 capsule 0  . vitamin E 400 UNIT capsule Take 400 Units by mouth daily.    . Cholecalciferol (VITAMIN D) 2000 units CAPS Take 1 capsule by mouth daily.     No facility-administered medications prior to visit.     Allergies  Allergen Reactions  . Morphine   . Other     ANTS.  Causes numbness / paralysis per pt  . Wasp Venom Other (See Comments)    Numbness / paralysis    ROS     Objective:    Physical Exam  BP 110/60 (BP Location: Left Arm, Patient Position: Sitting, Cuff Size: Normal)   Pulse 70   Temp 98.9 F (37.2 C) (Oral)   Resp 18   Ht 5\' 11"  (1.803 m)   Wt 186 lb 9.6 oz (84.6 kg)   SpO2 98%   BMI 26.03 kg/m  Wt Readings from Last 3 Encounters:  07/12/16 186 lb 9.6 oz (84.6 kg)  03/31/16 203 lb 12.8 oz (92.4 kg)  11/13/15 200 lb 9.6 oz (91 kg)   BP Readings from Last 3 Encounters:  07/12/16 110/60  05/30/16 100/62  03/31/16 114/68     Immunization History  Administered Date(s) Administered  . Tdap 03/07/2006    Health Maintenance  Topic Date Due  . HIV Screening  05/16/1975  . MAMMOGRAM  05/16/2010  . COLONOSCOPY  05/16/2010  . TETANUS/TDAP  03/07/2016  . INFLUENZA VACCINE  10/05/2016  . PAP SMEAR  02/01/2018  . Hepatitis C Screening  Completed    Lab Results  Component Value Date   WBC 6.5 05/30/2016   HGB 14.1 05/30/2016   HCT 42.1 05/30/2016   PLT 243.0 05/30/2016   GLUCOSE 118 (H) 05/30/2016   CHOL 189 02/02/2015   TRIG 151.0 (H)  02/02/2015   HDL 49.90 02/02/2015   LDLCALC 109 (H) 02/02/2015   ALT 71 (H)  05/30/2016   AST 43 (H) 05/30/2016   NA 140 05/30/2016   K 3.8 05/30/2016   CL 106 05/30/2016   CREATININE 0.71 05/30/2016   BUN 12 05/30/2016   CO2 28 05/30/2016   TSH 2.77 05/30/2016   HGBA1C 5.7 05/30/2016    Lab Results  Component Value Date   TSH 2.77 05/30/2016   Lab Results  Component Value Date   WBC 6.5 05/30/2016   HGB 14.1 05/30/2016   HCT 42.1 05/30/2016   MCV 94.7 05/30/2016   PLT 243.0 05/30/2016   Lab Results  Component Value Date   NA 140 05/30/2016   K 3.8 05/30/2016   CO2 28 05/30/2016   GLUCOSE 118 (H) 05/30/2016   BUN 12 05/30/2016   CREATININE 0.71 05/30/2016   BILITOT 0.3 05/30/2016   ALKPHOS 132 (H) 05/30/2016   AST 43 (H) 05/30/2016   ALT 71 (H) 05/30/2016   PROT 7.3 05/30/2016   ALBUMIN 4.2 05/30/2016   CALCIUM 9.1 05/30/2016   GFR 90.49 05/30/2016   Lab Results  Component Value Date   CHOL 189 02/02/2015   Lab Results  Component Value Date   HDL 49.90 02/02/2015   Lab Results  Component Value Date   LDLCALC 109 (H) 02/02/2015   Lab Results  Component Value Date   TRIG 151.0 (H) 02/02/2015   Lab Results  Component Value Date   CHOLHDL 4 02/02/2015   Lab Results  Component Value Date   HGBA1C 5.7 05/30/2016         Assessment & Plan:   Problem List Items Addressed This Visit    None      I have discontinued Ms. Kniseley's Vitamin D. I am also having her maintain her EPINEPHrine, L-THEANINE PO, TURMERIC CURCUMIN PO, Vitamin D (Ergocalciferol), albuterol, OVER THE COUNTER MEDICATION, OVER THE COUNTER MEDICATION, vitamin E, SELENIUM PO, MILK THISTLE PO, and ALPHA LIPOIC ACID PO.  No orders of the defined types were placed in this encounter.   CMA served as Neurosurgeon during this visit. History, Physical and Plan performed by medical provider. Documentation and orders reviewed and attested to.  Crissie Sickles, [place]

## 2016-07-12 NOTE — Progress Notes (Signed)
Pre visit review using our clinic review tool, if applicable. No additional management support is needed unless otherwise documented below in the visit note. 

## 2016-07-12 NOTE — Patient Instructions (Signed)
Palpitations A palpitation is the feeling that your heartbeat is irregular or is faster than normal. It may feel like your heart is fluttering or skipping a beat. Palpitations are usually not a serious problem. They may be caused by many things, including smoking, caffeine, alcohol, stress, and certain medicines. Although most causes of palpitations are not serious, palpitations can be a sign of a serious medical problem. In some cases, you may need further medical evaluation. Follow these instructions at home: Pay attention to any changes in your symptoms. Take these actions to help with your condition:  Avoid the following: ? Caffeinated coffee, tea, soft drinks, diet pills, and energy drinks. ? Chocolate. ? Alcohol.  Do not use any tobacco products, such as cigarettes, chewing tobacco, and e-cigarettes. If you need help quitting, ask your health care provider.  Try to reduce your stress and anxiety. Things that can help you relax include: ? Yoga. ? Meditation. ? Physical activity, such as swimming, jogging, or walking. ? Biofeedback. This is a method that helps you learn to use your mind to control things in your body, such as your heartbeats.  Get plenty of rest and sleep.  Take over-the-counter and prescription medicines only as told by your health care provider.  Keep all follow-up visits as told by your health care provider. This is important.  Contact a health care provider if:  You continue to have a fast or irregular heartbeat after 24 hours.  Your palpitations occur more often. Get help right away if:  You have chest pain or shortness of breath.  You have a severe headache.  You feel dizzy or you faint. This information is not intended to replace advice given to you by your health care provider. Make sure you discuss any questions you have with your health care provider. Document Released: 02/19/2000 Document Revised: 07/27/2015 Document Reviewed: 11/06/2014 Elsevier  Interactive Patient Education  2017 Elsevier Inc.  

## 2016-07-12 NOTE — Assessment & Plan Note (Signed)
Palpitations occurring more frequently as the weather has warmed up. Not always with exercise. She had a Holter Monitor placed it showed baseline sinus rhythm, sinus brady and sinus tachy, one isolated PVC, rare PACs, one pair, no runs or pauses

## 2016-07-12 NOTE — Assessment & Plan Note (Signed)
Has cut out carbs significantly and she has lost a good bit of weight. continue

## 2016-07-13 ENCOUNTER — Encounter: Payer: Self-pay | Admitting: Family Medicine

## 2016-07-13 NOTE — Assessment & Plan Note (Signed)
Mild likely fatty liver disease. Minimize simple carbs and attempt modest weight loss

## 2016-07-13 NOTE — Assessment & Plan Note (Signed)
Continue to supplement and monitor 

## 2016-07-20 ENCOUNTER — Ambulatory Visit (HOSPITAL_BASED_OUTPATIENT_CLINIC_OR_DEPARTMENT_OTHER)
Admission: RE | Admit: 2016-07-20 | Discharge: 2016-07-20 | Disposition: A | Discharge status: Home / Self Care | Payer: BLUE CROSS/BLUE SHIELD | Source: Ambulatory Visit | Attending: Family Medicine | Admitting: Family Medicine

## 2016-07-20 DIAGNOSIS — R0602 Shortness of breath: Secondary | ICD-10-CM | POA: Diagnosis not present

## 2016-07-20 DIAGNOSIS — R002 Palpitations: Secondary | ICD-10-CM

## 2016-07-20 NOTE — Progress Notes (Signed)
  Echocardiogram 2D Echocardiogram has been performed.  Janalyn HarderWest, Fernando Torry R 07/20/2016, 3:58 PM

## 2016-07-26 ENCOUNTER — Ambulatory Visit (INDEPENDENT_AMBULATORY_CARE_PROVIDER_SITE_OTHER): Payer: BLUE CROSS/BLUE SHIELD | Admitting: Family Medicine

## 2016-07-26 ENCOUNTER — Encounter: Payer: Self-pay | Admitting: Family Medicine

## 2016-07-26 VITALS — BP 110/66 | HR 69 | Temp 98.7°F | Resp 18 | Wt 186.0 lb

## 2016-07-26 DIAGNOSIS — B351 Tinea unguium: Secondary | ICD-10-CM | POA: Diagnosis not present

## 2016-07-26 DIAGNOSIS — E079 Disorder of thyroid, unspecified: Secondary | ICD-10-CM | POA: Diagnosis not present

## 2016-07-26 DIAGNOSIS — E559 Vitamin D deficiency, unspecified: Secondary | ICD-10-CM | POA: Diagnosis not present

## 2016-07-26 DIAGNOSIS — R739 Hyperglycemia, unspecified: Secondary | ICD-10-CM

## 2016-07-26 DIAGNOSIS — H04123 Dry eye syndrome of bilateral lacrimal glands: Secondary | ICD-10-CM

## 2016-07-26 DIAGNOSIS — E538 Deficiency of other specified B group vitamins: Secondary | ICD-10-CM

## 2016-07-26 HISTORY — DX: Hyperglycemia, unspecified: R73.9

## 2016-07-26 MED ORDER — CICLOPIROX 8 % EX SOLN: Freq: Every day | CUTANEOUS | 2 refills | Status: DC

## 2016-07-26 NOTE — Assessment & Plan Note (Signed)
Given rx for Penlac at her request

## 2016-07-26 NOTE — Assessment & Plan Note (Addendum)
minimize simple carbs. Increase exercise as tolerated.  

## 2016-07-26 NOTE — Progress Notes (Signed)
Subjective:  I acted as a Neurosurgeon for Dr. Abner Greenspan. [place], [place]   Patient ID: Hailey Cochran, female    DOB: [birth year], 56 y.o.   MRN: [id number]  Chief Complaint  Patient presents with  . Follow-up    HPI  Patient is in today for a follow up appointment. Patient c/o toe nail fungus on both feet. She also c/o of dry eyes She denies any injury. No recent febrile illness or acute hospitalizations. Denies CP/palp/SOB/HA/congestion/fevers/GI or GU c/o. Taking meds as prescribed. No new concerns although she does endorse ongoing trouble with dry eyes and irritation.    Patient Care Team: Bradd Canary, MD as PCP - General (Family Medicine) Monica Becton, MD as Consulting Physician (Sports Medicine)   Past Medical History:  Diagnosis Date  . Anemia   . Cervical cancer screening 02/02/2015   Menarche at 6, stop at 7.5, restarted at 10 Regular and heavy flow and painful from endocmetriosis No history of abnormal pap in past G5P2, s/p 3 miscarriages, 2 svd No history of abnormal MGM, never had a MGM, no concerns, nursed 8 kids, declines MGM for now No concerns today  gyn surgeries: right oophorectomy for an endometrioma LMP at age 56  . Dry eyes 07/27/2016  . Elevated antinuclear antibody (ANA) level   . Elevated liver enzymes   . Endometriosis   . Fibromyalgia   . Glaucoma   . History of endometriosis   . Hyperglycemia 07/26/2016  . Lumbar herniated disc   . Overweight 09/29/2014  . Overweight 09/29/2014  . Palpitations 07/12/2016  . Premature ovarian failure    30s  . Preventative health care 02/07/2015  . 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 Vitamin B 12 deficiency     Past Surgical History:  Procedure Laterality Date  . SALPINGOOPHORECTOMY Right 1990   endometriosis    Family History  Problem Relation Age of Onset  . Dementia Mother        "lewey body"  . Leukemia Mother   . Diabetes Mother        diet controlled  . Hypertension Mother          diet controlled  . Parkinson's disease Mother   . Hepatitis C Father         2 strains  . Cancer Father        hepatocellular carcinoma  . Hypertension Father   . Bullous pemphigoid Father   . Alcohol abuse Father        drug abuse  . Muscular dystrophy Paternal Uncle   . Other Sister        fatty liver disease  . Rickets Son   . Gallstones Maternal Aunt   . Anemia Maternal Aunt   . Diabetes Maternal Grandmother   . Dementia Maternal Grandmother   . Gallstones Maternal Grandfather   . Heart disease Maternal Grandfather        cardiomegaly  . Alzheimer's disease Paternal Grandfather   . Hypertension Paternal Grandfather   . Single kidney Daughter   . Eczema Son     Social History   Social History  . Marital status: Married    Spouse name: N/A  . Number of children: 8  . 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 Smoking status: Never Smoker  . Smokeless tobacco: Never Used  . Alcohol use No  . Drug use:  No  . Sexual activity: Yes     Comment: lives with children, husband, sister in law and GM, no dietary restrictions avoids pork and alcohol.   Other Topics Concern  . Not on file   Social History Narrative   2 biological children [id number]- daughter [name]                                    [id number]- [name]   3 miscarraiges       Adopted children:   56 year old- [name]   56 year old amelia   56 year old evan michael   7 yosiah   6 jeshua   6 kaleb daniel      Married- Samuel   Homeschools her children   Enjoys Psychologist, forensicbible journaling, Air traffic controllerworshiping, dancing   Christian, jewish heritage    Outpatient Medications Prior to Visit  Medication Sig Dispense Refill  . albuterol (PROVENTIL HFA;VENTOLIN HFA) 108 (90 Base) MCG/ACT inhaler Inhale 2 puffs into the lungs every 6 (six) hours as needed for wheezing or shortness of breath. 1 Inhaler 1  . ALPHA LIPOIC ACID PO Take 1 capsule by mouth daily.    Marland Kitchen.  EPINEPHrine 0.3 mg/0.3 mL IJ SOAJ injection Inject 0.3 mLs (0.3 mg total) into the muscle as needed. 2 Device 1  . L-THEANINE PO Take by mouth daily as needed.     Marland Kitchen. MILK THISTLE PO Take 1 capsule by mouth daily.    Marland Kitchen. OVER THE COUNTER MEDICATION NAC. Take 2 capsules daily    . OVER THE COUNTER MEDICATION NATO KINASE.  Take 2 capsules daily    . SELENIUM PO Take 1 capsule by mouth daily.    . TURMERIC CURCUMIN PO Take by mouth daily.    . Vitamin D, Ergocalciferol, (DRISDOL) 50000 units CAPS capsule TAKE 1 CAPSULE BY MOUTH EVERY 7 DAYS 12 capsule 0  . vitamin E 400 UNIT capsule Take 400 Units by mouth daily.     No facility-administered medications prior to visit.     Allergies  Allergen Reactions  . Morphine   . Other     ANTS.  Causes numbness / paralysis per pt  . Wasp Venom Other (See Comments)    Numbness / paralysis    Review of Systems  Constitutional: Positive for malaise/fatigue. Negative for fever.  HENT: Negative for congestion.   Eyes: Negative for blurred vision, double vision, photophobia and pain.  Respiratory: Negative for cough and shortness of breath.   Cardiovascular: Negative for chest pain, palpitations and leg swelling.  Gastrointestinal: Negative for vomiting.  Musculoskeletal: Negative for back pain.  Skin: Negative for rash.  Neurological: Negative for loss of consciousness and headaches.       Objective:    Physical Exam  Constitutional: She is oriented to person, place, and time. She appears well-developed and well-nourished. No distress.  HENT:  Head: Normocephalic and atraumatic.  Eyes: Conjunctivae are normal.  Neck: Normal range of motion. No thyromegaly present.  Cardiovascular: Normal rate and regular rhythm.   Pulmonary/Chest: Effort normal and breath sounds normal. She has no wheezes.  Abdominal: Soft. Bowel sounds are normal. There is no tenderness.  Musculoskeletal: Normal range of motion. She exhibits no edema or deformity.   Neurological: She is alert and oriented to person, place, and time.  Skin: Skin is warm and dry. She is not diaphoretic.  Psychiatric: She has a normal mood and  affect.    BP 110/66 (BP Location: Left Arm, Patient Position: Sitting, Cuff Size: Normal)   Pulse 69   Temp 98.7 F (37.1 C) (Oral)   Resp 18   Wt 186 lb (84.4 kg)   SpO2 100%   BMI 25.94 kg/m  Wt Readings from Last 3 Encounters:  07/26/16 186 lb (84.4 kg)  07/12/16 186 lb 9.6 oz (84.6 kg)  03/31/16 203 lb 12.8 oz (92.4 kg)   BP Readings from Last 3 Encounters:  07/26/16 110/66  07/12/16 110/60  05/30/16 100/62     Immunization History  Administered Date(s) Administered  . Tdap 03/07/2006    Health Maintenance  Topic Date Due  . HIV Screening  05/16/1975  . MAMMOGRAM  05/16/2010  . COLONOSCOPY  05/16/2010  . TETANUS/TDAP  03/07/2016  . INFLUENZA VACCINE  10/05/2016  . PAP SMEAR  02/01/2018  . Hepatitis C Screening  Completed    Lab Results  Component Value Date   WBC 6.5 05/30/2016   HGB 14.1 05/30/2016   HCT 42.1 05/30/2016   PLT 243.0 05/30/2016   GLUCOSE 118 (H) 05/30/2016   CHOL 189 02/02/2015   TRIG 151.0 (H) 02/02/2015   HDL 49.90 02/02/2015   LDLCALC 109 (H) 02/02/2015   ALT 71 (H) 05/30/2016   AST 43 (H) 05/30/2016   NA 140 05/30/2016   K 3.8 05/30/2016   CL 106 05/30/2016   CREATININE 0.71 05/30/2016   BUN 12 05/30/2016   CO2 28 05/30/2016   TSH 2.77 05/30/2016   HGBA1C 5.7 05/30/2016    Lab Results  Component Value Date   TSH 2.77 05/30/2016   Lab Results  Component Value Date   WBC 6.5 05/30/2016   HGB 14.1 05/30/2016   HCT 42.1 05/30/2016   MCV 94.7 05/30/2016   PLT 243.0 05/30/2016   Lab Results  Component Value Date   NA 140 05/30/2016   K 3.8 05/30/2016   CO2 28 05/30/2016   GLUCOSE 118 (H) 05/30/2016   BUN 12 05/30/2016   CREATININE 0.71 05/30/2016   BILITOT 0.3 05/30/2016   ALKPHOS 132 (H) 05/30/2016   AST 43 (H) 05/30/2016   ALT 71 (H) 05/30/2016    PROT 7.3 05/30/2016   ALBUMIN 4.2 05/30/2016   CALCIUM 9.1 05/30/2016   GFR 90.49 05/30/2016   Lab Results  Component Value Date   CHOL 189 02/02/2015   Lab Results  Component Value Date   HDL 49.90 02/02/2015   Lab Results  Component Value Date   LDLCALC 109 (H) 02/02/2015   Lab Results  Component Value Date   TRIG 151.0 (H) 02/02/2015   Lab Results  Component Value Date   CHOLHDL 4 02/02/2015   Lab Results  Component Value Date   HGBA1C 5.7 05/30/2016         Assessment & Plan:   Problem List Items Addressed This Visit    Onychomycosis    Given rx for Penlac at her request      Relevant Medications   ciclopirox (PENLAC) 8 % solution   Vitamin D deficiency    Continue vitamin D daily via drops      Relevant Orders   VITAMIN D 25 Hydroxy (Vit-D Deficiency, Fractures)   Vitamin B 12 deficiency    Takes Vitamin B12 in a B complex      Relevant Orders   Vitamin B12   Thyroid disease   Relevant Orders   TSH   Hyperglycemia    minimize simple carbs.  Increase exercise as tolerated.      Relevant Orders   Hemoglobin A1c   CBC   Comprehensive metabolic panel   Lipid panel   Dry eyes    Encouraged increased hydration. Offered referral to opthamology but she declines. Advised on OTC hydrating drops       Other Visit Diagnoses    Toenail fungus    -  Primary   Relevant Medications   ciclopirox (PENLAC) 8 % solution      I am having Ms. Kniseley start on ciclopirox. I am also having her maintain her EPINEPHrine, L-THEANINE PO, TURMERIC CURCUMIN PO, Vitamin D (Ergocalciferol), albuterol, OVER THE COUNTER MEDICATION, OVER THE COUNTER MEDICATION, vitamin E, SELENIUM PO, MILK THISTLE PO, and ALPHA LIPOIC ACID PO.  Meds ordered this encounter  Medications  . ciclopirox (PENLAC) 8 % solution    Sig: Apply topically at bedtime. Apply over nail and surrounding skin. Apply daily over previous coat. After seven (7) days, may remove with alcohol and  continue cycle.    Dispense:  6.6 mL    Refill:  2    CMA served as scribe during this visit. History, Physical and Plan performed by medical provider. Documentation and orders reviewed and attested to.  Danise Edge, MD

## 2016-07-26 NOTE — Assessment & Plan Note (Signed)
Check cmp 

## 2016-07-26 NOTE — Patient Instructions (Addendum)
Send MyChart to Dr. Abner GreenspanBlyth about Eye drops and Nasal Drops Spray athletes foot for the shoes     Dry Eye Dry eye, also called keratoconjunctivitis sicca, is dryness of the membranes surrounding the eye. It happens when there are not enough healthy, natural tears in the eyes. The eyes must remain moist at all times. A small amount of tears is constantly produced by the tear glands (lacrimal glands). These glands are located under the outside part of the upper eyelids. Dryness of the eyes can be a symptom of a variety of conditions, such as rheumatoid arthritis, lupus, or Sjgren syndrome. Dry eye may be mild to severe. What are the causes? This condition may be caused by:  Not making enough tears (aqueous tear-deficient dry eyes).  Tears evaporating from the eye too quickly (evaporative dry eyes). This is when there is an abnormality in the quality of your tears. This abnormality causes your tears to evaporate so quickly that the eye cannot be kept moist. What increases the risk? This condition is more likely to happen in:  Women, especially those who have gone through menopause.  People in dry climates.  People in dusty or smoky areas.  People who take certain medicines, such as:  Anti-allergy medicines (antihistamines).  Blood pressure medicines (antihypertensives).  Birth control pills (oral contraceptives).  Laxatives.  Tranquilizers. What are the signs or symptoms? Symptoms in the eyes may include:  Irritation.  Itchiness.  Redness.  Burning.  Inflammation of the eyelids.  Feeling as though something is stuck in the eye.  Light sensitivity.  Increased sensitivity and discomfort when wearing contact lenses, if this applies.  Vision that varies throughout the day.  Occasional excessive tearing. How is this diagnosed? This condition is diagnosed based on your symptoms, your medical history, and an eye exam. Your health care provider may look at your eye using  a microscope and may put dyes in your eye to check the health of the surface of your eye. You may have a tests, such as a test to evaluate your tear production (Schirmer test). During this test, a small strip of special paper is gently pressed into the inner corner of your eye. Your tear production is measured by how much of the paper is moistened by your tears during a set amount of time. You may be referred to a health care provider who specializes in eyes and eyesight (ophthalmologist). How is this treated? This condition is often treated at home. Your health care provider may recommend eye drops, which are also called artificial tears. If your condition is severe, treatment may include:  Prescription eye drops.  Over-the-counter or prescription ointments to moisten your eyes.  Minor surgery to block tears from going into your nose.  Medicines to reduce inflammation of the eyelids. Follow these instructions at home:  Take, use, or apply over-the-counter and prescription medicines only as told by your health care provider. This includes eye drops.  If directed, apply a warm compress to your eyes to help reduce inflammation. Place a towel over your eyes and gently press the warm compress over your eyes for about 5 minutes, or as long as told by your health care provider.  If possible, avoid dry, drafty environments.  Use a humidifier at home to increase moisture in the air.  If you wear contact lenses, remove them regularly to give your eyes a break. Always remove contacts before sleeping.  Keep all follow-up visits as told by your health care provider. This  is important. This includes yearly eye exams and vision tests. Contact a health care provider if:  You have eye pain.  You have pus-like fluid coming from your eye.  Your symptoms get worse or do not improve with treatment. Get help right away if:  Your vision suddenly changes. This information is not intended to replace  advice given to you by your health care provider. Make sure you discuss any questions you have with your health care provider. Document Released: 01/09/2004 Document Revised: 07/30/2015 Document Reviewed: 12/17/2014 Elsevier Interactive Patient Education  2017 [employer].

## 2016-07-26 NOTE — Assessment & Plan Note (Signed)
Continue vitamin D daily via drops

## 2016-07-26 NOTE — Assessment & Plan Note (Signed)
Takes Vitamin B12 in a B complex

## 2016-07-27 ENCOUNTER — Encounter: Payer: Self-pay | Admitting: Family Medicine

## 2016-07-27 DIAGNOSIS — H04123 Dry eye syndrome of bilateral lacrimal glands: Secondary | ICD-10-CM

## 2016-07-27 HISTORY — DX: Dry eye syndrome of bilateral lacrimal glands: H04.123

## 2016-07-27 NOTE — Assessment & Plan Note (Signed)
Encouraged increased hydration. Offered referral to opthamology but she declines. Advised on OTC hydrating drops

## 2016-07-28 ENCOUNTER — Encounter: Payer: Self-pay | Admitting: Family Medicine

## 2016-08-21 NOTE — Progress Notes (Deleted)
Cardiology Office Note   Date:  08/21/2016   ID:  Hailey Cochran, DOB 1960/09/19, MRN [id number]  PCP:  Bradd Canary, MD  Cardiologist:   Chilton Si, MD   No chief complaint on file.     History of Present Illness: Hailey Cochran is a 56 y.o. female with fibromyalgia who is being seen today for the evaluation of palpitations and shortness of breath at the request of Bradd Canary, MD.  She had an echo 07/20/16 that revealed LVEF 60-65% and normal diastolic function.  She also had a 24 hour holter 06/2016 that revealed occasional PACs and PVCs but no arrhythmias.   Past Medical History:  Diagnosis Date  . Anemia   . Cervical cancer screening 02/02/2015   Menarche at 6, stop at 7.5, restarted at 10 Regular and heavy flow and painful from endocmetriosis No history of abnormal pap in past G5P2, s/p 3 miscarriages, 2 svd No history of abnormal MGM, never had a MGM, no concerns, nursed 8 kids, declines MGM for now No concerns today  gyn surgeries: right oophorectomy for an endometrioma LMP at age 32  . Dry eyes 07/27/2016  . Elevated antinuclear antibody (ANA) level   . Elevated liver enzymes   . Endometriosis   . Fibromyalgia   . Glaucoma   . History of endometriosis   . Hyperglycemia 07/26/2016  . Lumbar herniated disc   . Overweight 09/29/2014  . Overweight 09/29/2014  . Palpitations 07/12/2016  . Premature ovarian failure    30s  . Preventative health care 02/07/2015  . 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 Vitamin B 12 deficiency     Past Surgical History:  Procedure Laterality Date  . SALPINGOOPHORECTOMY Right 1990   endometriosis     Current Outpatient Prescriptions  Medication Sig Dispense Refill  . albuterol (PROVENTIL HFA;VENTOLIN HFA) 108 (90 Base) MCG/ACT inhaler Inhale 2 puffs into the lungs every 6 (six) hours as needed for wheezing or shortness of breath. 1 Inhaler 1  . ALPHA LIPOIC ACID PO Take 1 capsule by mouth daily.    .  ciclopirox (PENLAC) 8 % solution Apply topically at bedtime. Apply over nail and surrounding skin. Apply daily over previous coat. After seven (7) days, may remove with alcohol and continue cycle. 6.6 mL 2  . EPINEPHrine 0.3 mg/0.3 mL IJ SOAJ injection Inject 0.3 mLs (0.3 mg total) into the muscle as needed. 2 Device 1  . L-THEANINE PO Take by mouth daily as needed.     Marland Kitchen MILK THISTLE PO Take 1 capsule by mouth daily.    Marland Kitchen OVER THE COUNTER MEDICATION NAC. Take 2 capsules daily    . OVER THE COUNTER MEDICATION NATO KINASE.  Take 2 capsules daily    . SELENIUM PO Take 1 capsule by mouth daily.    . TURMERIC CURCUMIN PO Take by mouth daily.    . Vitamin D, Ergocalciferol, (DRISDOL) 50000 units CAPS capsule TAKE 1 CAPSULE BY MOUTH EVERY 7 DAYS 12 capsule 0  . vitamin E 400 UNIT capsule Take 400 Units by mouth daily.     No current facility-administered medications for this visit.     Allergies:   Morphine; Other; and Wasp venom    Social History:  The patient  reports that she has never smoked. She has never used smokeless tobacco. She reports that she does not drink alcohol or use drugs.   Family History:  The patient's ***family history includes  Alcohol abuse in her father; Alzheimer's disease in her paternal grandfather; Anemia in her maternal aunt; Bullous pemphigoid in her father; Cancer in her father; Dementia in her maternal grandmother and mother; Diabetes in her maternal grandmother and mother; Eczema in her son; Gallstones in her maternal aunt and maternal grandfather; Heart disease in her maternal grandfather; Hepatitis C in her father; Hypertension in her father, mother, and paternal grandfather; Leukemia in her mother; Muscular dystrophy in her paternal uncle; Other in her sister; Parkinson's disease in her mother; Rickets in her son; Single kidney in her daughter.    ROS:  Please see the history of present illness.   Otherwise, review of systems are positive for {NONE  DEFAULTED:18576::"none"}.   All other systems are reviewed and negative.    PHYSICAL EXAM: VS:  There were no vitals taken for this visit. , BMI There is no height or weight on file to calculate BMI. GENERAL:  Well appearing HEENT:  Pupils equal round and reactive, fundi not visualized, oral mucosa unremarkable NECK:  No jugular venous distention, waveform within normal limits, carotid upstroke brisk and symmetric, no bruits, no thyromegaly LYMPHATICS:  No cervical adenopathy LUNGS:  Clear to auscultation bilaterally HEART:  RRR.  PMI not displaced or sustained,S1 and S2 within normal limits, no S3, no S4, no clicks, no rubs, *** murmurs ABD:  Flat, positive bowel sounds normal in frequency in pitch, no bruits, no rebound, no guarding, no midline pulsatile mass, no hepatomegaly, no splenomegaly EXT:  2 plus pulses throughout, no edema, no cyanosis no clubbing SKIN:  No rashes no nodules NEURO:  Cranial nerves II through XII grossly intact, motor grossly intact throughout PSYCH:  Cognitively intact, oriented to person place and time    EKG:  EKG {ACTION; IS/IS OZH:[id number]} ordered today. The ekg ordered today demonstrates ***  Echo 07/20/16: Study Conclusions  - Left ventricle: The cavity size was normal. Systolic function was   normal. The estimated ejection fraction was in the range of 60%   to 65%. Wall motion was normal; there were no regional wall   motion abnormalities. Left ventricular diastolic function   parameters were normal. - Mitral valve: There was mild regurgitation. - Atrial septum: No defect or patent foramen ovale was identified. - Pericardium, extracardiac: A trivial pericardial effusion was   identified posterior to the heart.  24 hour Holter 06/28/16:  Rare PAC (6) and rare PVC (1)  No atrial fibrillation  No adverse rhythms  Palpitaions relate to PAC's. Benign.  Recent Labs: 05/30/2016: ALT 71; BUN 12; Creatinine, Ser 0.71; Hemoglobin 14.1; Platelets  243.0; Potassium 3.8; Sodium 140; TSH 2.77    Lipid Panel    Component Value Date/Time   CHOL 189 02/02/2015 1159   TRIG 151.0 (H) 02/02/2015 1159   HDL 49.90 02/02/2015 1159   CHOLHDL 4 02/02/2015 1159   VLDL 30.2 02/02/2015 1159   LDLCALC 109 (H) 02/02/2015 1159      Wt Readings from Last 3 Encounters:  07/26/16 84.4 kg (186 lb)  07/12/16 84.6 kg (186 lb 9.6 oz)  03/31/16 92.4 kg (203 lb 12.8 oz)      ASSESSMENT AND PLAN:  ***   Current medicines are reviewed at length with the patient today.  The patient {ACTIONS; HAS/DOES NOT HAVE:19233} concerns regarding medicines.  The following changes have been made:  {PLAN; NO CHANGE:13088:s}  Labs/ tests ordered today include: *** No orders of the defined types were placed in this encounter.    Disposition:  FU with ***    This note was written with the assistance of speech recognition software.  Please excuse any transcriptional errors.  Signed, Eyvonne Burchfield C. Duke Salviaandolph, MD, [facility]ACC  08/21/2016 8:53 PM    [facility]

## 2016-08-22 ENCOUNTER — Ambulatory Visit: Payer: BLUE CROSS/BLUE SHIELD | Admitting: Cardiovascular Disease

## 2016-08-26 ENCOUNTER — Encounter: Payer: Self-pay | Admitting: Family Medicine

## 2016-08-30 ENCOUNTER — Other Ambulatory Visit: Payer: Self-pay | Admitting: Family Medicine

## 2016-08-30 MED ORDER — FLUTICASONE PROPIONATE 50 MCG/ACT NA SUSP: 2.0000 | Freq: Every day | NASAL | 6 refills | Status: DC

## 2016-08-30 NOTE — Telephone Encounter (Signed)
Dr Abner GreenspanBlyth-- flonase not on current med list. Was on profile in 2015. Please advise if ok to send Rx and directions?

## 2016-09-09 ENCOUNTER — Encounter: Payer: Self-pay | Admitting: Family Medicine

## 2016-09-12 ENCOUNTER — Other Ambulatory Visit: Payer: Self-pay | Admitting: Family Medicine

## 2016-09-12 DIAGNOSIS — E079 Disorder of thyroid, unspecified: Secondary | ICD-10-CM

## 2016-09-23 ENCOUNTER — Encounter: Payer: Self-pay | Admitting: Family Medicine

## 2016-10-05 ENCOUNTER — Ambulatory Visit: Payer: BLUE CROSS/BLUE SHIELD | Admitting: Cardiovascular Disease

## 2016-10-06 ENCOUNTER — Encounter: Payer: BLUE CROSS/BLUE SHIELD | Admitting: Family Medicine

## 2016-11-02 ENCOUNTER — Ambulatory Visit: Payer: BLUE CROSS/BLUE SHIELD | Admitting: Cardiovascular Disease

## 2016-11-09 ENCOUNTER — Encounter: Payer: Self-pay | Admitting: Family Medicine

## 2016-11-09 ENCOUNTER — Ambulatory Visit (INDEPENDENT_AMBULATORY_CARE_PROVIDER_SITE_OTHER): Payer: BLUE CROSS/BLUE SHIELD | Admitting: Family Medicine

## 2016-11-09 DIAGNOSIS — E079 Disorder of thyroid, unspecified: Secondary | ICD-10-CM

## 2016-11-09 DIAGNOSIS — E559 Vitamin D deficiency, unspecified: Secondary | ICD-10-CM

## 2016-11-09 DIAGNOSIS — R739 Hyperglycemia, unspecified: Secondary | ICD-10-CM | POA: Diagnosis not present

## 2016-11-09 DIAGNOSIS — E538 Deficiency of other specified B group vitamins: Secondary | ICD-10-CM | POA: Diagnosis not present

## 2016-11-09 DIAGNOSIS — Z8639 Personal history of other endocrine, nutritional and metabolic disease: Secondary | ICD-10-CM

## 2016-11-09 LAB — COMPREHENSIVE METABOLIC PANEL
ALT: 26 U/L (ref 0–35)
AST: 25 U/L (ref 0–37)
Albumin: 4.5 g/dL (ref 3.5–5.2)
Alkaline Phosphatase: 106 U/L (ref 39–117)
BILIRUBIN TOTAL: 0.3 mg/dL (ref 0.2–1.2)
BUN: 14 mg/dL (ref 6–23)
CALCIUM: 9.3 mg/dL (ref 8.4–10.5)
CHLORIDE: 106 meq/L (ref 96–112)
CO2: 23 meq/L (ref 19–32)
Creatinine, Ser: 0.66 mg/dL (ref 0.40–1.20)
GFR: 98.29 mL/min (ref >60.00)
GLUCOSE: 87 mg/dL (ref 70–99)
Potassium: 3.8 mEq/L (ref 3.5–5.1)
Sodium: 139 mEq/L (ref 135–145)
Total Protein: 7.7 g/dL (ref 6.0–8.3)

## 2016-11-09 LAB — TSH: TSH: 5.13 u[IU]/mL — ABNORMAL HIGH (ref 0.35–4.50)

## 2016-11-09 LAB — VITAMIN B12: VITAMIN B 12: 415 pg/mL (ref 211–911)

## 2016-11-11 LAB — CBC WITH DIFFERENTIAL/PLATELET
BASOS ABS: 60 {cells}/uL (ref 0–200)
Basophils Relative: 0.9 %
EOS PCT: 3.4 %
Eosinophils Absolute: 228 cells/uL (ref 15–500)
HCT: 41.9 % (ref 35.0–45.0)
HEMOGLOBIN: 14.3 g/dL (ref 11.7–15.5)
Lymphs Abs: 1735 cells/uL (ref 850–3900)
MCH: 31.6 pg (ref 27.0–33.0)
MCHC: 34.1 g/dL (ref 32.0–36.0)
MCV: 92.5 fL (ref 80.0–100.0)
MONOS PCT: 7.2 %
MPV: 12.4 fL (ref 7.5–12.5)
NEUTROS ABS: 4194 {cells}/uL (ref 1500–7800)
Neutrophils Relative %: 62.6 %
PLATELETS: 221 10*3/uL (ref 140–400)
RBC: 4.53 10*6/uL (ref 3.80–5.10)
RDW: 12.6 % (ref 11.0–15.0)
TOTAL LYMPHOCYTE: 25.9 %
WBC mixed population: 482 cells/uL (ref 200–950)
WBC: 6.7 10*3/uL (ref 3.8–10.8)

## 2016-11-12 LAB — VITAMIN D 1,25 DIHYDROXY
VITAMIN D 1, 25 (OH) TOTAL: 56 pg/mL (ref 18–72)
Vitamin D2 1, 25 (OH)2: 18 pg/mL
Vitamin D3 1, 25 (OH)2: 38 pg/mL

## 2016-11-12 LAB — HEMOGLOBIN A1C
Hgb A1c MFr Bld: 5.4 % of total Hgb (ref <5.7)
MEAN PLASMA GLUCOSE: 108 (calc)
eAG (mmol/L): 6 (calc)

## 2016-11-12 LAB — LIPID PANEL
CHOL/HDL RATIO: 3.4 (calc) (ref <5.0)
Cholesterol: 193 mg/dL (ref <200)
HDL: 56 mg/dL (ref >50)
LDL Cholesterol (Calc): 116 mg/dL (calc) — ABNORMAL HIGH
Non-HDL Cholesterol (Calc): 137 mg/dL (calc) — ABNORMAL HIGH (ref <130)
Triglycerides: 103 mg/dL (ref <150)

## 2016-11-12 LAB — T3: T3 TOTAL: 113 ng/dL (ref 76–181)

## 2016-11-12 LAB — THYROID PEROXIDASE ANTIBODY: Thyroperoxidase Ab SerPl-aCnc: 1 IU/mL (ref <9)

## 2016-11-12 LAB — T4: T4 TOTAL: 7.6 ug/dL (ref 5.1–11.9)

## 2016-11-13 NOTE — Progress Notes (Signed)
Lab only visit 

## 2016-11-21 ENCOUNTER — Encounter: Payer: Self-pay | Admitting: Cardiovascular Disease

## 2016-11-21 ENCOUNTER — Ambulatory Visit (INDEPENDENT_AMBULATORY_CARE_PROVIDER_SITE_OTHER): Payer: BLUE CROSS/BLUE SHIELD | Admitting: Cardiovascular Disease

## 2016-11-21 VITALS — BP 102/64 | HR 77 | Ht 71.0 in | Wt 182.4 lb

## 2016-11-21 DIAGNOSIS — R002 Palpitations: Secondary | ICD-10-CM

## 2016-11-21 NOTE — Patient Instructions (Signed)
Medication Instructions:  .Your physician recommends that you continue on your current medications as directed. Please refer to the Current Medication list given to you today.  Labwork: none  Testing/Procedures: none  Follow-Up: As needed   

## 2016-11-21 NOTE — Progress Notes (Signed)
Cardiology Office Note   Date:  11/21/2016   ID:  Hailey Cochran, DOB 04-25-1960, MRN [id number]  PCP:  Bradd Canary, MD  Cardiologist:   Chilton Si, MD   Chief Complaint  Patient presents with  . New Evaluation    pt c/o occasional palpitations--had them in the summer in [place], possibly caused by heat, has happened 2 times since she has been back--one time was while doing nothing, another time pt states she was upset      History of Present Illness: Hailey Cochran is a 56 y.o. female who presents for an evaluation of palpitations and shortness of breath.  Ms. Hailey Cochran saw Dr. Rogelia Rohrer on 07/2016 and reported palpitations?  She has been experiencing palpitations for years.  She had premature ovarian failure and went into menopause at 25.  Since then, whenever she gets hot her heart starts racing.  She had a lot of this while on vacation in [place].  She gets profoundly fatigued and feels wiped out.  It feels like a vacuum cleaner has sucked all the life out of her. The symptoms improve when she gets into cooler weather.  She is very physically active and doesn't get the palpitations with exertion unless she gets too hot.  She denies chest pain or pressure.    Ms. Aughenbaugh drinks one cup of coffee daily.  She doesn't use any over the counter cold or cough mediations.  She has a very healthy diet and limits fried/fatty foods.  She swims daily and has no exertional symptoms.  She denies Lower extremity edema, orthopnea, or PND.   Past Medical History:  Diagnosis Date  . Anemia   . Cervical cancer screening 02/02/2015   Menarche at 6, stop at 7.5, restarted at 10 Regular and heavy flow and painful from endocmetriosis No history of abnormal pap in past G5P2, s/p 3 miscarriages, 2 svd No history of abnormal MGM, never had a MGM, no concerns, nursed 8 kids, declines MGM for now No concerns today  gyn surgeries: right oophorectomy  for an endometrioma LMP at age 74  . Dry eyes 07/27/2016  . Elevated antinuclear antibody (ANA) level   . Elevated liver enzymes   . Endometriosis   . Fibromyalgia   . Glaucoma   . History of endometriosis   . Hyperglycemia 07/26/2016  . Lumbar herniated disc   . Overweight 09/29/2014  . Overweight 09/29/2014  . Palpitations 07/12/2016  . Premature ovarian failure    30s  . Preventative health care 02/07/2015  . 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 Vitamin B 12 deficiency     Past Surgical History:  Procedure Laterality Date  . SALPINGOOPHORECTOMY Right 1990   endometriosis     Current Outpatient Prescriptions  Medication Sig Dispense Refill  . EPINEPHrine 0.3 mg/0.3 mL IJ SOAJ injection Inject 0.3 mLs (0.3 mg total) into the muscle as needed. 2 Device 1  . OVER THE COUNTER MEDICATION NAC. Take 2 capsules daily     No current facility-administered medications for this visit.     Allergies:   Morphine; Other; and Wasp venom    Social History:  The patient  reports that she has never smoked. She has never used smokeless tobacco. She reports that she does not drink alcohol or use drugs.   Family History:  The patient's family history includes Alcohol abuse in her father; Alzheimer's disease in her paternal grandfather; Anemia in her maternal  aunt; Bullous pemphigoid in her father; Cancer in her father; Dementia in her maternal grandmother and mother; Diabetes in her maternal grandmother and mother; Eczema in her son; Gallstones in her maternal aunt and maternal grandfather; Heart disease in her maternal grandfather; Hepatitis C in her father; Hypertension in her father, mother, and paternal grandfather; Leukemia in her mother; Muscular dystrophy in her paternal uncle; Other in her sister; Parkinson's disease in her mother; Rickets in her son; Single kidney in her daughter.    ROS:  Please see the history of present illness.   Otherwise, review of systems are  positive for none.   All other systems are reviewed and negative.    PHYSICAL EXAM: VS:  BP 102/64 (BP Location: Left Arm, Patient Position: Sitting, Cuff Size: Normal)   Pulse 77   Ht  (1.803 m)   Wt 82.7 kg (182 lb 6.4 oz)   BMI 25.44 kg/m  , BMI Body mass index is 25.44 kg/m. GENERAL:  Well appearing HEENT:  Pupils equal round and reactive, fundi not visualized, oral mucosa unremarkable NECK:  No jugular venous distention, waveform within normal limits, carotid upstroke brisk and symmetric, no bruits, no thyromegaly LUNGS:  Clear to auscultation bilaterally HEART:  RRR.  PMI not displaced or sustained,S1 and S2 within normal limits, no S3, no S4, no clicks, no rubs, no murmurs ABD:  Flat, positive bowel sounds normal in frequency in pitch, no bruits, no rebound, no guarding, no midline pulsatile mass, no hepatomegaly, no splenomegaly EXT:  2 plus pulses throughout, no edema, no cyanosis no clubbing SKIN:  No rashes no nodules NEURO:  Cranial nerves II through XII grossly intact, motor grossly intact throughout PSYCH:  Cognitively intact, oriented to person place and time    EKG:  EKG is ordered today. The ekg ordered today demonstrates sinus rhythm.  Rate 77 bpm.     Recent Labs: 11/09/2016: ALT 26; BUN 14; Creatinine, Ser 0.66; Hemoglobin 14.3; Platelets 221; Potassium 3.8; Sodium 139; TSH 5.13    Lipid Panel    Component Value Date/Time   CHOL 193 11/09/2016 0948   TRIG 103 11/09/2016 0948   HDL 56 11/09/2016 0948   CHOLHDL 3.4 11/09/2016 0948   VLDL 30.2 02/02/2015 1159   LDLCALC 109 (H) 02/02/2015 1159      Wt Readings from Last 3 Encounters:  11/21/16 82.7 kg (182 lb 6.4 oz)  07/26/16 84.4 kg (186 lb)  07/12/16 84.6 kg (186 lb 9.6 oz)      ASSESSMENT AND PLAN:  # Palpitations: Ms. Jaspers symptoms seem more related to weather than her heart.  It seems that her palpitations are a symptoms of what is going on rather than the cause.  Her hot,  flushed feeling occurs prior to the palpitations.  These do not occur with exertion and she has no chest pain or shortness of breath.  She reported feeling some palpitations while in the room and she was noted to have sinus rhythm.  We discussed the possibility of wearing an ambulatory monitor, but she agrees that this would not be useful.  She will continue to seek treatment and better understanding of her thyroid and post-menopausal symptoms.    Current medicines are reviewed at length with the patient today.  The patient does not have concerns regarding medicines.  The following changes have been made:  no change  Labs/ tests ordered today include:  No orders of the defined types were placed in this encounter.    Disposition:  FU with Rheanna Sergent C. Duke Salvia, MD, [facility] as needed.      This note was written with the assistance of speech recognition software.  Please excuse any transcriptional errors.  Signed, Dama Hedgepeth C. Duke Salvia, MD, [facility]  11/21/2016 9:49 AM    [facility]

## 2016-12-26 ENCOUNTER — Ambulatory Visit (INDEPENDENT_AMBULATORY_CARE_PROVIDER_SITE_OTHER): Payer: BLUE CROSS/BLUE SHIELD | Admitting: Family Medicine

## 2016-12-26 ENCOUNTER — Encounter: Payer: Self-pay | Admitting: Family Medicine

## 2016-12-26 DIAGNOSIS — Z0000 Encounter for general adult medical examination without abnormal findings: Secondary | ICD-10-CM

## 2016-12-26 DIAGNOSIS — E663 Overweight: Secondary | ICD-10-CM

## 2016-12-26 DIAGNOSIS — E538 Deficiency of other specified B group vitamins: Secondary | ICD-10-CM

## 2016-12-26 DIAGNOSIS — Z8639 Personal history of other endocrine, nutritional and metabolic disease: Secondary | ICD-10-CM | POA: Diagnosis not present

## 2016-12-26 DIAGNOSIS — E559 Vitamin D deficiency, unspecified: Secondary | ICD-10-CM

## 2016-12-26 DIAGNOSIS — R739 Hyperglycemia, unspecified: Secondary | ICD-10-CM | POA: Diagnosis not present

## 2016-12-26 DIAGNOSIS — K5909 Other constipation: Secondary | ICD-10-CM

## 2016-12-26 MED ORDER — ALBUTEROL SULFATE HFA 108 (90 BASE) MCG/ACT IN AERS: 2.0000 | INHALATION_SPRAY | Freq: Four times a day (QID) | RESPIRATORY_TRACT | 0 refills | Status: DC | PRN

## 2016-12-26 NOTE — Progress Notes (Signed)
Subjective:  .pac  Patient ID: Hailey Cochran, female    DOB: 09-11-1960, 56 y.o.   MRN: [id number]  No chief complaint on file.   HPI  Patient is in today for an annual exam and follow up on hyperglycemia, Vitamin D deficiency. She Is doing well. She has recently had to put her father on palliative care due to end-stage liver disease secondary to a history of hepatitis C. Otherwise she offers no significant complaints. She has ongoing trouble with fibromyalgia but manages it well with healthy eating and exercise. She has had a good bit of weight loss with improved portion control. She has occasional lesions on her hands but none at the present time. They occur and resolve and her like small blisters without any other symptoms. She denies any recent hospitalization or febrile illness. Denies CP/palp/SOB/HA/congestion/fevers/GI or GU c/o. Taking meds as prescribed Patient Care Team: Bradd Canary, MD as PCP - General (Family Medicine) Monica Becton, MD as Consulting Physician (Sports Medicine)   Past Medical History:  Diagnosis Date  . Anemia   . Cervical cancer screening 02/02/2015   Menarche at 6, stop at 7.5, restarted at 10 Regular and heavy flow and painful from endocmetriosis No history of abnormal pap in past G5P2, s/p 3 miscarriages, 2 svd No history of abnormal MGM, never had a MGM, no concerns, nursed 8 kids, declines MGM for now No concerns today  gyn surgeries: right oophorectomy for an endometrioma LMP at age 65  . Dry eyes 07/27/2016  . Elevated antinuclear antibody (ANA) level   . Elevated liver enzymes   . Endometriosis   . Fibromyalgia   . Glaucoma   . History of endometriosis   . Hyperglycemia 07/26/2016  . Lumbar herniated disc   . Overweight 09/29/2014  . Overweight 09/29/2014  . Palpitations 07/12/2016  . Premature ovarian failure    30s  . Preventative health care 02/07/2015  . 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  Vitamin B 12 deficiency     Past Surgical History:  Procedure Laterality Date  . SALPINGOOPHORECTOMY Right 1990   endometriosis    Family History  Problem Relation Age of Onset  . Dementia Mother        "lewey body"  . Leukemia Mother   . Diabetes Mother        diet controlled  . Hypertension Mother        diet controlled  . Parkinson's disease Mother   . Hepatitis C Father         2 strains  . Cancer Father        hepatocellular carcinoma  . Hypertension Father   . Bullous pemphigoid Father   . Alcohol abuse Father        drug abuse  . Muscular dystrophy Paternal Uncle   . Other Sister        fatty liver disease  . Rickets Son   . Gallstones Maternal Aunt   . Anemia Maternal Aunt   . Diabetes Maternal Grandmother   . Dementia Maternal Grandmother   . Gallstones Maternal Grandfather   . Heart disease Maternal Grandfather        cardiomegaly  . Alzheimer's disease Paternal Grandfather   . Hypertension Paternal Grandfather   . Single kidney Daughter   . Eczema Son     Social History   Social History  . Marital status: Married    Spouse name: N/A  .  Number of children: 8  . 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 Smoking status: Never Smoker  . Smokeless tobacco: Never Used  . Alcohol use No  . Drug use: No  . Sexual activity: Yes     Comment: lives with children, husband, sister in law and GM, no dietary restrictions avoids pork and alcohol.   Other Topics Concern  . Not on file   Social History Narrative   2 biological children [id number]- daughter [name]                                    [id number]- [name]   3 miscarraiges       Adopted children:   56 year old- [name]   56 year old amelia   56 year old evan michael   7 yosiah   6 jeshua   6 kaleb daniel      Married- Samuel   Homeschools her children   Enjoys Psychologist, forensicbible journaling, Air traffic controllerworshiping, dancing   Christian, jewish heritage     Outpatient Medications Prior to Visit  Medication Sig Dispense Refill  . EPINEPHrine 0.3 mg/0.3 mL IJ SOAJ injection Inject 0.3 mLs (0.3 mg total) into the muscle as needed. 2 Device 1  . OVER THE COUNTER MEDICATION NAC. Take 2 capsules daily     No facility-administered medications prior to visit.     Allergies  Allergen Reactions  . Morphine   . Other     ANTS.  Causes numbness / paralysis per pt  . Wasp Venom Other (See Comments)    Numbness / paralysis    Review of Systems  Constitutional: Negative for chills, fever and malaise/fatigue.  HENT: Negative for congestion and hearing loss.   Eyes: Negative for discharge.  Respiratory: Negative for cough, sputum production and shortness of breath.   Cardiovascular: Negative for chest pain, palpitations and leg swelling.  Gastrointestinal: Negative for abdominal pain, blood in stool, constipation, diarrhea, heartburn, nausea and vomiting.  Genitourinary: Negative for dysuria, frequency, hematuria and urgency.  Musculoskeletal: Negative for back pain, falls and myalgias.  Skin: Negative for rash.  Neurological: Negative for dizziness, sensory change, loss of consciousness, weakness and headaches.  Endo/Heme/Allergies: Negative for environmental allergies. Does not bruise/bleed easily.  Psychiatric/Behavioral: Negative for depression and suicidal ideas. The patient is not nervous/anxious and does not have insomnia.        Objective:    Physical Exam  Constitutional: She is oriented to person, place, and time. She appears well-developed and well-nourished. No distress.  HENT:  Head: Normocephalic and atraumatic.  Eyes: Conjunctivae are normal.  Neck: Neck supple. No thyromegaly present.  Cardiovascular: Normal rate, regular rhythm and normal heart sounds.   No murmur heard. Pulmonary/Chest: Effort normal and breath sounds normal. No respiratory distress.  Abdominal: Soft. Bowel sounds are normal. She exhibits no distension  and no mass. There is no tenderness.  Musculoskeletal: She exhibits no edema.  Lymphadenopathy:    She has no cervical adenopathy.  Neurological: She is alert and oriented to person, place, and time.  Skin: Skin is warm and dry.  Psychiatric: She has a normal mood and affect. Her behavior is normal.    BP 110/64 (BP Location: Left Arm, Patient Position: Sitting, Cuff Size: Normal)   Pulse 71   Temp 98.4 F (36.9 C) (Oral)   Resp 18  Wt 182 lb 3.2 oz (82.6 kg)   SpO2 97%   BMI 25.41 kg/m  Wt Readings from Last 3 Encounters:  12/26/16 182 lb 3.2 oz (82.6 kg)  11/21/16 182 lb 6.4 oz (82.7 kg)  07/26/16 186 lb (84.4 kg)   BP Readings from Last 3 Encounters:  12/26/16 110/64  11/21/16 102/64  07/26/16 110/66     Immunization History  Administered Date(s) Administered  . Tdap 03/07/2006    Health Maintenance  Topic Date Due  . HIV Screening  05/16/1975  . MAMMOGRAM  05/16/2010  . COLONOSCOPY  05/16/2010  . TETANUS/TDAP  03/07/2016  . PAP SMEAR  02/01/2018  . Hepatitis C Screening  Completed  . INFLUENZA VACCINE  Excluded    Lab Results  Component Value Date   WBC 6.7 11/09/2016   HGB 14.3 11/09/2016   HCT 41.9 11/09/2016   PLT 221 11/09/2016   GLUCOSE 87 11/09/2016   CHOL 193 11/09/2016   TRIG 103 11/09/2016   HDL 56 11/09/2016   LDLCALC 109 (H) 02/02/2015   ALT 26 11/09/2016   AST 25 11/09/2016   NA 139 11/09/2016   K 3.8 11/09/2016   CL 106 11/09/2016   CREATININE 0.66 11/09/2016   BUN 14 11/09/2016   CO2 23 11/09/2016   TSH 5.13 (H) 11/09/2016   HGBA1C 5.4 11/09/2016    Lab Results  Component Value Date   TSH 5.13 (H) 11/09/2016   Lab Results  Component Value Date   WBC 6.7 11/09/2016   HGB 14.3 11/09/2016   HCT 41.9 11/09/2016   MCV 92.5 11/09/2016   PLT 221 11/09/2016   Lab Results  Component Value Date   NA 139 11/09/2016   K 3.8 11/09/2016   CO2 23 11/09/2016   GLUCOSE 87 11/09/2016   BUN 14 11/09/2016   CREATININE 0.66  11/09/2016   BILITOT 0.3 11/09/2016   ALKPHOS 106 11/09/2016   AST 25 11/09/2016   ALT 26 11/09/2016   PROT 7.7 11/09/2016   ALBUMIN 4.5 11/09/2016   CALCIUM 9.3 11/09/2016   GFR 98.29 11/09/2016   Lab Results  Component Value Date   CHOL 193 11/09/2016   Lab Results  Component Value Date   HDL 56 11/09/2016   Lab Results  Component Value Date   LDLCALC 109 (H) 02/02/2015   Lab Results  Component Value Date   TRIG 103 11/09/2016   Lab Results  Component Value Date   CHOLHDL 3.4 11/09/2016   Lab Results  Component Value Date   HGBA1C 5.4 11/09/2016         Assessment & Plan:   Problem List Items Addressed This Visit    Vitamin D deficiency    Normal wihtout supplements.       Vitamin B 12 deficiency    WNL on recent check      Overweight    Good weight loss with eating smaller portions of larger amounts of fruits and veg. Moving more.      Preventative health care    Patient encouraged to maintain heart healthy diet, regular exercise, adequate sleep. Consider daily probiotics. Take medications as prescribed      Other constipation    Improved with improved diet and exercise.      Hyperglycemia    hgba1c acceptable, minimize simple carbs. Increase exercise as tolerated.          I am having Hailey Cochran maintain her EPINEPHrine and OVER THE COUNTER MEDICATION.  No orders of the  defined types were placed in this encounter.   CMA served as Neurosurgeon during this visit. History, Physical and Plan performed by medical provider. Documentation and orders reviewed and attested to.  Danise Edge, MD

## 2016-12-26 NOTE — Assessment & Plan Note (Signed)
Normal wihtout supplements.

## 2016-12-26 NOTE — Assessment & Plan Note (Signed)
WNL on recent check 

## 2016-12-26 NOTE — Assessment & Plan Note (Addendum)
Good weight loss with eating smaller portions of larger amounts of fruits and veg. Moving more.

## 2016-12-26 NOTE — Assessment & Plan Note (Signed)
Improved with improved diet and exercise.

## 2016-12-26 NOTE — Patient Instructions (Signed)
Preventive Care 40-64 Years, Female Preventive care refers to lifestyle choices and visits with your health care provider that can promote health and wellness. What does preventive care include?  A yearly physical exam. This is also called an annual well check.  Dental exams once or twice a year.  Routine eye exams. Ask your health care provider how often you should have your eyes checked.  Personal lifestyle choices, including: ? Daily care of your teeth and gums. ? Regular physical activity. ? Eating a healthy diet. ? Avoiding tobacco and drug use. ? Limiting alcohol use. ? Practicing safe sex. ? Taking low-dose aspirin daily starting at age 56. ? Taking vitamin and mineral supplements as recommended by your health care provider. What happens during an annual well check? The services and screenings done by your health care provider during your annual well check will depend on your age, overall health, lifestyle risk factors, and family history of disease. Counseling Your health care provider may ask you questions about your:  Alcohol use.  Tobacco use.  Drug use.  Emotional well-being.  Home and relationship well-being.  Sexual activity.  Eating habits.  Work and work Statistician.  Method of birth control.  Menstrual cycle.  Pregnancy history.  Screening You may have the following tests or measurements:  Height, weight, and BMI.  Blood pressure.  Lipid and cholesterol levels. These may be checked every 5 years, or more frequently if you are over 81 years old.  Skin check.  Lung cancer screening. You may have this screening every year starting at age 56 if you have a 30-pack-year history of smoking and currently smoke or have quit within the past 15 years.  Fecal occult blood test (FOBT) of the stool. You may have this test every year starting at age 56.  Flexible sigmoidoscopy or colonoscopy. You may have a sigmoidoscopy every 5 years or a colonoscopy  every 10 years starting at age 56.  Hepatitis C blood test.  Hepatitis B blood test.  Sexually transmitted disease (STD) testing.  Diabetes screening. This is done by checking your blood sugar (glucose) after you have not eaten for a while (fasting). You may have this done every 1-3 years.  Mammogram. This may be done every 1-2 years. Talk to your health care provider about when you should start having regular mammograms. This may depend on whether you have a family history of breast cancer.  BRCA-related cancer screening. This may be done if you have a family history of breast, ovarian, tubal, or peritoneal cancers.  Pelvic exam and Pap test. This may be done every 3 years starting at age 56. Starting at age 36, this may be done every 5 years if you have a Pap test in combination with an HPV test.  Bone density scan. This is done to screen for osteoporosis. You may have this scan if you are at high risk for osteoporosis.  Discuss your test results, treatment options, and if necessary, the need for more tests with your health care provider. Vaccines Your health care provider may recommend certain vaccines, such as:  Influenza vaccine. This is recommended every year.  Tetanus, diphtheria, and acellular pertussis (Tdap, Td) vaccine. You may need a Td booster every 10 years.  Varicella vaccine. You may need this if you have not been vaccinated.  Zoster vaccine. You may need this after age 56.  Measles, mumps, and rubella (MMR) vaccine. You may need at least one dose of MMR if you were born in  1957 or later. You may also need a second dose.  Pneumococcal 13-valent conjugate (PCV13) vaccine. You may need this if you have certain conditions and were not previously vaccinated.  Pneumococcal polysaccharide (PPSV23) vaccine. You may need one or two doses if you smoke cigarettes or if you have certain conditions.  Meningococcal vaccine. You may need this if you have certain  conditions.  Hepatitis A vaccine. You may need this if you have certain conditions or if you travel or work in places where you may be exposed to hepatitis A.  Hepatitis B vaccine. You may need this if you have certain conditions or if you travel or work in places where you may be exposed to hepatitis B.  Haemophilus influenzae type b (Hib) vaccine. You may need this if you have certain conditions.  Talk to your health care provider about which screenings and vaccines you need and how often you need them. This information is not intended to replace advice given to you by your health care provider. Make sure you discuss any questions you have with your health care provider. Document Released: 03/20/2015 Document Revised: 11/11/2015 Document Reviewed: 12/23/2014 Elsevier Interactive Patient Education  2017 [employer].

## 2016-12-26 NOTE — Assessment & Plan Note (Signed)
Patient encouraged to maintain heart healthy diet, regular exercise, adequate sleep. Consider daily probiotics. Take medications as prescribed 

## 2016-12-26 NOTE — Assessment & Plan Note (Signed)
hgba1c acceptable, minimize simple carbs. Increase exercise as tolerated.  

## 2016-12-27 LAB — THYROGLOBULIN ANTIBODY

## 2017-08-04 ENCOUNTER — Encounter: Payer: Self-pay | Admitting: Family Medicine

## 2017-08-06 ENCOUNTER — Other Ambulatory Visit: Payer: Self-pay | Admitting: Family Medicine

## 2017-08-06 MED ORDER — LEVOCETIRIZINE DIHYDROCHLORIDE 5 MG PO TABS: 5.0000 mg | ORAL_TABLET | Freq: Every evening | ORAL | 3 refills | Status: DC

## 2017-10-18 ENCOUNTER — Other Ambulatory Visit: Payer: Self-pay | Admitting: Family Medicine

## 2017-11-12 ENCOUNTER — Other Ambulatory Visit: Payer: Self-pay | Admitting: Family Medicine

## 2018-01-25 ENCOUNTER — Other Ambulatory Visit: Payer: Self-pay | Admitting: Family Medicine

## 2018-03-03 ENCOUNTER — Other Ambulatory Visit: Payer: Self-pay | Admitting: Family Medicine

## 2018-03-05 ENCOUNTER — Other Ambulatory Visit: Payer: Self-pay | Admitting: Family Medicine

## 2018-03-27 ENCOUNTER — Ambulatory Visit: Payer: BLUE CROSS/BLUE SHIELD | Admitting: Family

## 2018-05-17 ENCOUNTER — Other Ambulatory Visit: Payer: Self-pay | Admitting: Family Medicine

## 2018-05-21 ENCOUNTER — Telehealth: Payer: Self-pay | Admitting: Family Medicine

## 2018-05-21 ENCOUNTER — Ambulatory Visit: Payer: Self-pay

## 2018-05-21 MED ORDER — PROAIR HFA 108 (90 BASE) MCG/ACT IN AERS: INHALATION_SPRAY | RESPIRATORY_TRACT | 5 refills | Status: DC

## 2018-05-21 MED ORDER — ALBUTEROL SULFATE HFA 108 (90 BASE) MCG/ACT IN AERS: 2.0000 | INHALATION_SPRAY | Freq: Four times a day (QID) | RESPIRATORY_TRACT | 5 refills | Status: DC | PRN

## 2018-05-21 NOTE — Telephone Encounter (Signed)
Pt stated she is calling for a refill for her Albuterol nebulizer.  Pt stated that she was getting over a cold and her floors were sanded and the dust traveled all over the house and caused her cough toi get worse on Wednesday. Pt is concerned she has bronchitis. Pt is coughing up clear phlegm. Pt stated she is not getting the same relief from her Albuterol MDI as she does with her nebulizer. Pt is having no fever or SOB or difficulty breathing. Pt with h/o asthma. Pt given home care advice and will route request for Albuterol nebulizer.  No international travel or travel to high risk areas in the [place]. Pt stated she has self quarantined for 2-4 weeks now.  Will send request for Albuterol nebulizer refill to provider.  Reason for Disposition . Cough with cold symptoms (e.g., runny nose, postnasal drip, throat clearing)  Answer Assessment - Initial Assessment Questions 1. ONSET: "When did the cough begin?"      Wednesday cough got worse was getting over a cold and cough then she had sanding and dust all over her house and cough worsened Wednesday 2. SEVERITY: "How bad is the cough today?"      Every once and and a while after taking allergy medication and Albuterol inhaler 3. RESPIRATORY DISTRESS: "Describe your breathing."      Not now Took Albuterol 1100 feeling better after Albuterol 4. FEVER: "Do you have a fever?" If so, ask: "What is your temperature, how was it measured, and when did it start?"     no 5. SPUTUM: "Describe the color of your sputum" (clear, white, yellow, green)     Clear  6. HEMOPTYSIS: "Are you coughing up any blood?" If so ask: "How much?" (flecks, streaks, tablespoons, etc.)     no 7. CARDIAC HISTORY: "Do you have any history of heart disease?" (e.g., heart attack, congestive heart failure)      no 8. LUNG HISTORY: "Do you have any history of lung disease?"  (e.g., pulmonary embolus, asthma, emphysema)     asthma 9. PE RISK FACTORS: "Do you have a history of blood  clots?" (or: recent major surgery, recent prolonged travel, bedridden)     no 10. OTHER SYMPTOMS: "Do you have any other symptoms?" (e.g., runny nose, wheezing, chest pain)       no 11. PREGNANCY: "Is there any chance you are pregnant?" "When was your last menstrual period?"       n/a 12. TRAVEL: "Have you traveled out of the country in the last month?" (e.g., travel history, exposures)       No self quarantined for 2-3 weeks  Protocols used: COUGH - ACUTE PRODUCTIVE-A-AH

## 2018-05-21 NOTE — Telephone Encounter (Signed)
Rx sent 

## 2018-05-21 NOTE — Telephone Encounter (Signed)
Generic albuterol sent

## 2018-05-21 NOTE — Telephone Encounter (Signed)
Copied from CRM [id number]. Topic: Quick Communication - Rx Refill/Question >> May 21, 2018  1:57 PM Wyonia Hough E wrote: Medication: PROAIR HFA 108 (90 Base) MCG/ACT inhaler - needs the lower cost brand option today   Has the patient contacted their pharmacy?  Yes - pharmacy advised Pt to call PCP and ask for an alternate brand that will be lower in cost due to no insurance   Preferred Pharmacy (with phone number or street name): [id number] - [place], [place] - [address] OF [address] [phone] (Phone) [phone] (Fax)    Agent: Please be advised that RX refills may take up to 3 business days. We ask that you follow-up with your pharmacy.

## 2018-05-21 NOTE — Addendum Note (Signed)
Addended byConrad Stovall D on: 05/21/2018 11:52 AM   Modules accepted: Orders

## 2018-05-22 ENCOUNTER — Other Ambulatory Visit: Payer: Self-pay | Admitting: Family Medicine

## 2018-05-22 ENCOUNTER — Telehealth: Payer: Self-pay | Admitting: Family Medicine

## 2018-05-22 MED ORDER — ALBUTEROL SULFATE (2.5 MG/3ML) 0.083% IN NEBU: 2.5000 mg | INHALATION_SOLUTION | Freq: Four times a day (QID) | RESPIRATORY_TRACT | 1 refills | Status: DC | PRN

## 2018-05-22 NOTE — Telephone Encounter (Signed)
Please advise 

## 2018-05-22 NOTE — Telephone Encounter (Addendum)
Copied from CRM [id number]. Topic: General - Other >> May 22, 2018  9:54 AM Leafy Ro wrote: reason for CRM: Pt is calling and she would like nebulizer solution for her nebulizer machine. Pt has albuterol inhaler . [facility] [address] . Pt would like a callback once rx has been sent

## 2018-05-22 NOTE — Telephone Encounter (Signed)
I have sent in nebs

## 2018-06-14 ENCOUNTER — Ambulatory Visit (INDEPENDENT_AMBULATORY_CARE_PROVIDER_SITE_OTHER): Payer: Self-pay | Admitting: Family Medicine

## 2018-06-14 ENCOUNTER — Other Ambulatory Visit: Payer: Self-pay

## 2018-06-14 DIAGNOSIS — J209 Acute bronchitis, unspecified: Secondary | ICD-10-CM

## 2018-06-14 MED ORDER — SPACER/AERO-HOLDING CHAMBERS DEVI: 0 refills | Status: DC

## 2018-06-14 MED ORDER — PROMETHAZINE-CODEINE 6.25-10 MG/5ML PO SYRP: 10.0000 mL | ORAL_SOLUTION | Freq: Four times a day (QID) | ORAL | 0 refills | Status: DC | PRN

## 2018-06-14 MED ORDER — CEFDINIR 300 MG PO CAPS: 300.0000 mg | ORAL_CAPSULE | Freq: Two times a day (BID) | ORAL | 0 refills | Status: DC

## 2018-06-14 MED ORDER — METHYLPREDNISOLONE 4 MG PO TBPK: ORAL_TABLET | ORAL | 0 refills | Status: DC

## 2018-06-14 MED ORDER — FLUTICASONE PROPIONATE HFA 110 MCG/ACT IN AERO: 2.0000 | INHALATION_SPRAY | Freq: Two times a day (BID) | RESPIRATORY_TRACT | 1 refills | Status: DC

## 2018-06-14 NOTE — Assessment & Plan Note (Addendum)
Has not been well since sometime in February when she developed a bronchitis type illness. She had been improving until they had some very dusty work done at her home which flared her again. She is coughing wheezing and SOB, fatigue, malaise. She is started on Cefdinir bid, Mucinex bid. Given a spacer for Albuterol HFA and she can continue to do nebs prn. Also given Flovent 110 mcg 2 puffs po bid and phenergan with codeine syrup for cough qhs. If still not improving can use medrol dosepak. Encouraged increased rest and hydration, add probiotics, zinc such as Coldeze or Xicam. Treat fevers as needed with Tylenol. Call if worsening

## 2018-06-14 NOTE — Progress Notes (Signed)
Virtual Visit via Telephone Note  I connected with Hailey Cochran on 06/14/18 at 10:40 AM EDT by telephone and verified that I am speaking with the correct person using two identifiers.   I discussed the limitations, risks, security and privacy concerns of performing an evaluation and management service by telephone and the availability of in person appointments. I also discussed with the patient that there may be a patient responsible charge related to this service. The patient expressed understanding and agreed to proceed. Patient contacted via telephone tried but was unable to complete a video visit.    History of Present Illness: she is interviewed via telephone she has not been well since sometime in February when she developed a bronchitis type illness. She had been improving until they had some very dusty work done at her home which flared her again. She is coughing wheezing and SOB, fatigue, malaise, HA. Denies CP/fevers/GI or GU c/o. Taking meds as prescribed    Observations/Objective: unable to obtain via telephone visit   Assessment and Plan:  Bronchitis, acute Has not been well since sometime in February when she developed a bronchitis type illness. She had been improving until they had some very dusty work done at her home which flared her again. She is coughing wheezing and SOB, fatigue, malaise. She is started on Cefdinir bid, Mucinex bid. Given a spacer for Albuterol HFA and she can continue to do nebs prn. Also given Flovent 110 mcg 2 puffs po bid and phenergan with codeine syrup for cough qhs. If still not improving can use medrol dosepak. Encouraged increased rest and hydration, add probiotics, zinc such as Coldeze or Xicam. Treat fevers as needed with Tylenol. Call if worsening    Follow Up Instructions: 3 weeks    I discussed the assessment and treatment plan with the patient. The patient was provided an opportunity to ask questions and all were  answered. The patient agreed with the plan and demonstrated an understanding of the instructions.   The patient was advised to call back or seek an in-person evaluation if the symptoms worsen or if the condition fails to improve as anticipated.  I provided 15 minutes of non-face-to-face time during this encounter.   Danise Edge, MD

## 2018-07-02 ENCOUNTER — Encounter: Payer: Self-pay | Admitting: Family Medicine

## 2018-07-19 ENCOUNTER — Encounter: Payer: Self-pay | Admitting: Family Medicine

## 2018-10-04 ENCOUNTER — Other Ambulatory Visit: Payer: Self-pay | Admitting: Family Medicine

## 2019-02-25 ENCOUNTER — Other Ambulatory Visit: Payer: Self-pay

## 2019-02-25 ENCOUNTER — Ambulatory Visit (INDEPENDENT_AMBULATORY_CARE_PROVIDER_SITE_OTHER): Payer: Self-pay | Admitting: Family Medicine

## 2019-02-25 ENCOUNTER — Encounter: Payer: Self-pay | Admitting: Family Medicine

## 2019-02-25 DIAGNOSIS — Z20822 Contact with and (suspected) exposure to covid-19: Secondary | ICD-10-CM

## 2019-02-25 DIAGNOSIS — Z20828 Contact with and (suspected) exposure to other viral communicable diseases: Secondary | ICD-10-CM

## 2019-02-25 DIAGNOSIS — M940 Chondrocostal junction syndrome [Tietze]: Secondary | ICD-10-CM

## 2019-02-25 MED ORDER — CYCLOBENZAPRINE HCL 10 MG PO TABS: 5.0000 mg | ORAL_TABLET | Freq: Three times a day (TID) | ORAL | 0 refills | Status: DC | PRN

## 2019-02-25 NOTE — Progress Notes (Signed)
Chief Complaint  Patient presents with  . COVID exposure    Burning in back where ribs are    Subjective: Patient is a 58 y.o. female here for covid exposure. Due to COVID-19 pandemic, we are interacting via web portal for an electronic face-to-face visit. I verified patient's ID using 2 identifiers. Patient agreed to proceed with visit via this method. Patient is at home, I am at office. Patient and I are present for visit.   Pt exposed to husband 5 d ago. He tested + for covid 2 d later. She is having some upper back pain, but no other URI s/s's. Denies fevers or gi s/s's. Hx of asthma, no sob or increased inhaler use. Has been using ibuprofen for her pains, still bothersome. No redness, swelling, bruising   ROS: Const: no fevers Lungs: no sob  Past Medical History:  Diagnosis Date  . Anemia   . Cervical cancer screening 02/02/2015   Menarche at 6, stop at 7.5, restarted at 10 Regular and heavy flow and painful from endocmetriosis No history of abnormal pap in past G5P2, s/p 3 miscarriages, 2 svd No history of abnormal MGM, never had a MGM, no concerns, nursed 8 kids, declines MGM for now No concerns today  gyn surgeries: right oophorectomy for an endometrioma LMP at age 53  . Dry eyes 07/27/2016  . Elevated antinuclear antibody (ANA) level   . Elevated liver enzymes   . Endometriosis   . Fibromyalgia   . Glaucoma   . History of endometriosis   . Hyperglycemia 07/26/2016  . Lumbar herniated disc   . Overweight 09/29/2014  . Overweight 09/29/2014  . Palpitations 07/12/2016  . Premature ovarian failure    30s  . Preventative health care 02/07/2015  . 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 Vitamin B 12 deficiency     Objective: SpO2 97%  No conversational dyspnea Age appropriate judgment and insight Nml affect and mood  Assessment and Plan: Close exposure to COVID-19 virus  Costochondritis - Plan: cyclobenzaprine (FLEXERIL) 10 MG tablet  1- Offered to  test, she declined as she had been quarantining anyway and it would not affect anything. Discussed warning s/s's that would require emergent care. 2- Cont NSAIDs. Stretching, heat, Tylenol, trial muscle relaxer. Warned of drowsiness.  F/u prn.  The patient voiced understanding and agreement to the plan.  [place], DO 02/25/19  4:30 PM

## 2019-04-24 ENCOUNTER — Telehealth: Payer: Self-pay | Admitting: Family Medicine

## 2019-04-24 NOTE — Telephone Encounter (Signed)
Called left message to call back 

## 2019-04-24 NOTE — Telephone Encounter (Signed)
I'm fine with ordering, but it's not a great test and difficult to accurately interpret. If she is OK with it, OK to order. Ty.

## 2019-04-24 NOTE — Telephone Encounter (Signed)
Patient states that she saw Dr.Wendling in December 2020 for covid related symptoms. Patient states that she would like an order faxed to [facility] for covid antibody testing @ [phone]  Please advise

## 2019-04-24 NOTE — Telephone Encounter (Signed)
Advise on this request and approve please advise on what/how to order

## 2019-04-24 NOTE — Telephone Encounter (Signed)
Called informed of PCP instructions//she decided to not do test.

## 2019-04-26 ENCOUNTER — Encounter: Payer: Self-pay | Admitting: Medical

## 2019-04-26 ENCOUNTER — Ambulatory Visit: Payer: Self-pay

## 2019-04-26 ENCOUNTER — Other Ambulatory Visit: Payer: Self-pay

## 2019-04-26 ENCOUNTER — Ambulatory Visit (INDEPENDENT_AMBULATORY_CARE_PROVIDER_SITE_OTHER): Payer: Self-pay | Admitting: Medical

## 2019-04-26 ENCOUNTER — Telehealth: Payer: Self-pay

## 2019-04-26 VITALS — BP 124/87 | HR 92

## 2019-04-26 DIAGNOSIS — R430 Anosmia: Secondary | ICD-10-CM

## 2019-04-26 DIAGNOSIS — R059 Cough, unspecified: Secondary | ICD-10-CM

## 2019-04-26 DIAGNOSIS — R05 Cough: Secondary | ICD-10-CM

## 2019-04-26 DIAGNOSIS — Z20822 Contact with and (suspected) exposure to covid-19: Secondary | ICD-10-CM

## 2019-04-26 DIAGNOSIS — R0600 Dyspnea, unspecified: Secondary | ICD-10-CM

## 2019-04-26 MED ORDER — FLUTICASONE PROPIONATE HFA 110 MCG/ACT IN AERO: 2.0000 | INHALATION_SPRAY | Freq: Two times a day (BID) | RESPIRATORY_TRACT | 12 refills | Status: DC

## 2019-04-26 MED ORDER — AZITHROMYCIN 250 MG PO TABS: ORAL_TABLET | ORAL | 0 refills | Status: DC

## 2019-04-26 NOTE — Telephone Encounter (Signed)
Called pt to confirm apt at 7:15 04/26/19, left message asking pt to call the office if she needs to reschedule.

## 2019-04-26 NOTE — Patient Instructions (Addendum)
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 Since you were never tested would recommend respiratory clinic today but you cancelled that appointment.(stating too far from your home)  So will place igg antibody test to get done [facility]. Will get cxr on Monday.  Then you may need antibiotic(zpack, flovent inhaler and  benzonate cough meds).  If igg is negative and symptoms persist consider covid testing for active infection but time line of illness more long haul type as opposed to acute illness.  Follow up early next week after review igg and cxr. Sooner if needed

## 2019-04-26 NOTE — Progress Notes (Addendum)
Subjective:    Patient ID: Hailey Cochran, female    DOB: [birth year], 59 y.o.   MRN: [id number]  HPI  Virtual Visit via Video Note  I connected with Hailey Cochran on 04/30/19 at  1:40 PM EST by a video enabled telemedicine application and verified that I am speaking with the correct person using two identifiers.  Location: Patient: home Provider: office   I discussed the limitations of evaluation and management by telemedicine and the availability of in person appointments. The patient expressed understanding and agreed to proceed.      Follow Up Instructions:    I discussed the assessment and treatment plan with the patient. The patient was provided an opportunity to ask questions and all were answered. The patient agreed with the plan and demonstrated an understanding of the instructions.   The patient was advised to call back or seek an in-person evaluation if the symptoms worsen or if the condition fails to improve as anticipated.  I provided 30  minutes of non-face-to-face time during this encounter.   Esperanza Richters, PA-C  Pt in for evaluation for recent cough. Pt states last year she had pneumonia and took while to recover. Regarding covid precautions she states staying isolated.   Pt does describe exposure to separated husband end of December(he tested + for covid). She treated herself at home and never got tested. She states had st, nasal congestion and ha. She states also had no taste or smell. She states had all above signs or symptoms for about 3 weeks moderate intesntiey . But never got better with  6 weeks has some persisting intermittent cough, some sob and she states she never gained her smell or taste back. She actually explains has distorted smells.    No leg pain, no leg swelling and no pain in poplital areas.  Review of Systems  Constitutional: Positive for fatigue.  HENT:       Change of smell/altered.   Cardiovascular: Negative for chest pain and palpitations.  Gastrointestinal: Negative for abdominal pain and constipation.  Musculoskeletal: Positive for arthralgias.  Neurological: Positive for dizziness and headaches.  Hematological: Negative for adenopathy. Does not bruise/bleed easily.  Psychiatric/Behavioral: Negative for behavioral problems and confusion.   Past Medical History:  Diagnosis Date  . Anemia   . Cervical cancer screening 02/02/2015   Menarche at 6, stop at 7.5, restarted at 10 Regular and heavy flow and painful from endocmetriosis No history of abnormal pap in past G5P2, s/p 3 miscarriages, 2 svd No history of abnormal MGM, never had a MGM, no concerns, nursed 8 kids, declines MGM for now No concerns today  gyn surgeries: right oophorectomy for an endometrioma LMP at age 39  . Dry eyes 07/27/2016  . Elevated antinuclear antibody (ANA) level   . Elevated liver enzymes   . Endometriosis   . Fibromyalgia   . Glaucoma   . History of endometriosis   . Hyperglycemia 07/26/2016  . Lumbar herniated disc   . Overweight 09/29/2014  . Overweight 09/29/2014  . Palpitations 07/12/2016  . Premature ovarian failure    30s  . Preventative health care 02/07/2015  . 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 Vitamin B 12 deficiency      Social History   Socioeconomic History  . Marital status: Married    Spouse name: Not on file  . Number of children: 8  . Years of education: Not on file  .  Highest education level: Not on file  Occupational History  . Occupation: Camera operator  Tobacco Use  . Smoking status: Never Smoker  . Smokeless tobacco: Never Used  Substance and Sexual Activity  . Alcohol use: No    Alcohol/week: 0.0 standard drinks  . Drug use: No  . Sexual activity: Yes    Comment: lives with children, husband, sister in law and GM, no dietary restrictions avoids pork and alcohol.  Other Topics Concern  . Not on file  Social History Narrative    2 biological children 62- daughter [name]                                    78- [name]   3 miscarraiges       Adopted children:   51 year old- [name]   28 year old amelia   59 year old evan michael   7 yosiah   6 jeshua   6 kaleb daniel      Married- Samuel   Homeschools her children   Enjoys Psychologist, forensic, Air traffic controller, dancing   Christian, jewish heritage   Social Determinants of Corporate investment banker Strain:   . Difficulty of Paying Living Expenses: Not on file  Food Insecurity:   . Worried About Programme researcher, broadcasting/film/video in the Last Year: Not on file  . Ran Out of Food in the Last Year: Not on file  Transportation Needs:   . Lack of Transportation (Medical): Not on file  . Lack of Transportation (Non-Medical): Not on file  Physical Activity:   . Days of Exercise per Week: Not on file  . Minutes of Exercise per Session: Not on file  Stress:   . Feeling of Stress : Not on file  Social Connections:   . Frequency of Communication with Friends and Family: Not on file  . Frequency of Social Gatherings with Friends and Family: Not on file  . Attends Religious Services: Not on file  . Active Member of Clubs or Organizations: Not on file  . Attends Banker Meetings: Not on file  . Marital Status: Not on file  Intimate Partner Violence:   . Fear of Current or Ex-Partner: Not on file  . Emotionally Abused: Not on file  . Physically Abused: Not on file  . Sexually Abused: Not on file    Past Surgical History:  Procedure Laterality Date  . SALPINGOOPHORECTOMY Right 1990   endometriosis    Family History  Problem Relation Age of Onset  . Dementia Mother        "lewey body"  . Leukemia Mother   . Diabetes Mother        diet controlled  . Hypertension Mother        diet controlled  . Parkinson's disease Mother   . Hepatitis C Father         2 strains  . Cancer Father        hepatocellular carcinoma  . Hypertension Father   . Bullous  pemphigoid Father   . Alcohol abuse Father        drug abuse  . Muscular dystrophy Paternal Uncle   . Other Sister        fatty liver disease  . Rickets Son   . Gallstones Maternal Aunt   . Anemia Maternal Aunt   . Diabetes Maternal Grandmother   . Dementia Maternal Grandmother   .  Gallstones Maternal Grandfather   . Heart disease Maternal Grandfather        cardiomegaly  . Alzheimer's disease Paternal Grandfather   . Hypertension Paternal Grandfather   . Single kidney Daughter   . Eczema Son     Allergies  Allergen Reactions  . Morphine   . Other     ANTS.  Causes numbness / paralysis per pt  . Wasp Venom Other (See Comments)    Numbness / paralysis    Current Outpatient Medications on File Prior to Visit  Medication Sig Dispense Refill  . albuterol (PROVENTIL HFA;VENTOLIN HFA) 108 (90 Base) MCG/ACT inhaler Inhale 2 puffs into the lungs every 6 (six) hours as needed for wheezing or shortness of breath. 18 g 5  . EPINEPHrine 0.3 mg/0.3 mL IJ SOAJ injection Inject 0.3 mLs (0.3 mg total) into the muscle as needed. 2 Device 1  . albuterol (PROVENTIL) (2.5 MG/3ML) 0.083% nebulizer solution Take 3 mLs (2.5 mg total) by nebulization every 6 (six) hours as needed for wheezing or shortness of breath. (Patient not taking: Reported on 04/26/2019) 150 mL 1  . cyclobenzaprine (FLEXERIL) 10 MG tablet Take 0.5-1 tablets (5-10 mg total) by mouth 3 (three) times daily as needed for muscle spasms. (Patient not taking: Reported on 04/26/2019) 21 tablet 0  . levocetirizine (XYZAL) 5 MG tablet TAKE 1 TABLET BY MOUTH EVERY EVENING (Patient not taking: Reported on 04/26/2019) 30 tablet 0   No current facility-administered medications on file prior to visit.    BP 124/87   Pulse 92   SpO2 95%       Objective:   Physical Exam   General-no acute distress, pleasant, oriented. Lungs- on inspection lungs appear unlabored. Neck- no tracheal deviation or jvd on inspection. Neuro- gross motor  function appears intact.     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 Since you were never tested would recommend respiratory clinic today but you cancelled that appointment.(stating too far from your home)  So will place igg antibody test to get done [facility]. Will get cxr on Monday.  Then you may need antibiotic(zpack, flovent inhaler and  benzonate cough meds).  If igg is negative and symptoms   If igg is negative and symptoms persist consider covid testing for active infection but time line of illness more long haul type as opposed to acute illness.  Mackie Pai, PA-C  Fax [phone]  30 + minutes spent with pt.

## 2019-04-29 ENCOUNTER — Encounter: Payer: Self-pay | Admitting: Medical

## 2019-04-29 DIAGNOSIS — Z20822 Contact with and (suspected) exposure to covid-19: Secondary | ICD-10-CM

## 2019-05-31 ENCOUNTER — Telehealth: Payer: Self-pay | Admitting: Medical

## 2019-05-31 ENCOUNTER — Other Ambulatory Visit: Payer: Self-pay

## 2019-05-31 ENCOUNTER — Ambulatory Visit (HOSPITAL_BASED_OUTPATIENT_CLINIC_OR_DEPARTMENT_OTHER)
Admission: RE | Admit: 2019-05-31 | Discharge: 2019-05-31 | Disposition: A | Discharge status: Home / Self Care | Payer: Self-pay | Source: Ambulatory Visit | Attending: Medical | Admitting: Medical

## 2019-05-31 ENCOUNTER — Encounter: Payer: Self-pay | Admitting: Medical

## 2019-05-31 DIAGNOSIS — R0600 Dyspnea, unspecified: Secondary | ICD-10-CM | POA: Insufficient documentation

## 2019-05-31 DIAGNOSIS — R059 Cough, unspecified: Secondary | ICD-10-CM

## 2019-05-31 DIAGNOSIS — R05 Cough: Secondary | ICD-10-CM | POA: Insufficient documentation

## 2019-05-31 DIAGNOSIS — R911 Solitary pulmonary nodule: Secondary | ICD-10-CM

## 2019-05-31 NOTE — Telephone Encounter (Signed)
Future cxr order placed. 

## 2019-06-18 ENCOUNTER — Encounter: Payer: Self-pay | Admitting: Family Medicine

## 2019-06-19 ENCOUNTER — Other Ambulatory Visit: Payer: Self-pay | Admitting: Family Medicine

## 2019-06-19 DIAGNOSIS — R059 Cough, unspecified: Secondary | ICD-10-CM

## 2019-06-19 DIAGNOSIS — R05 Cough: Secondary | ICD-10-CM

## 2019-06-19 DIAGNOSIS — R911 Solitary pulmonary nodule: Secondary | ICD-10-CM

## 2019-07-03 ENCOUNTER — Other Ambulatory Visit: Payer: Self-pay

## 2019-07-03 ENCOUNTER — Ambulatory Visit (HOSPITAL_BASED_OUTPATIENT_CLINIC_OR_DEPARTMENT_OTHER)
Admission: RE | Admit: 2019-07-03 | Discharge: 2019-07-03 | Disposition: A | Discharge status: Home / Self Care | Payer: Self-pay | Source: Ambulatory Visit | Attending: Family Medicine | Admitting: Family Medicine

## 2019-07-03 DIAGNOSIS — R059 Cough, unspecified: Secondary | ICD-10-CM

## 2019-07-03 DIAGNOSIS — R05 Cough: Secondary | ICD-10-CM | POA: Insufficient documentation

## 2019-07-03 DIAGNOSIS — R911 Solitary pulmonary nodule: Secondary | ICD-10-CM | POA: Insufficient documentation

## 2019-07-08 ENCOUNTER — Other Ambulatory Visit: Payer: Self-pay

## 2019-07-08 ENCOUNTER — Telehealth (INDEPENDENT_AMBULATORY_CARE_PROVIDER_SITE_OTHER): Payer: Self-pay | Admitting: Family Medicine

## 2019-07-08 VITALS — BP 110/87 | Temp 97.0°F | Wt 163.0 lb

## 2019-07-08 DIAGNOSIS — F419 Anxiety disorder, unspecified: Secondary | ICD-10-CM

## 2019-07-08 DIAGNOSIS — R911 Solitary pulmonary nodule: Secondary | ICD-10-CM

## 2019-07-08 DIAGNOSIS — F4310 Post-traumatic stress disorder, unspecified: Secondary | ICD-10-CM

## 2019-07-08 DIAGNOSIS — E785 Hyperlipidemia, unspecified: Secondary | ICD-10-CM

## 2019-07-08 DIAGNOSIS — U099 Post covid-19 condition, unspecified: Secondary | ICD-10-CM

## 2019-07-08 DIAGNOSIS — R739 Hyperglycemia, unspecified: Secondary | ICD-10-CM

## 2019-07-08 DIAGNOSIS — E538 Deficiency of other specified B group vitamins: Secondary | ICD-10-CM

## 2019-07-08 DIAGNOSIS — U071 COVID-19: Secondary | ICD-10-CM

## 2019-07-08 DIAGNOSIS — E559 Vitamin D deficiency, unspecified: Secondary | ICD-10-CM

## 2019-07-08 HISTORY — DX: Solitary pulmonary nodule: R91.1

## 2019-07-08 HISTORY — DX: Anxiety disorder, unspecified: F41.9

## 2019-07-08 HISTORY — DX: Hyperlipidemia, unspecified: E78.5

## 2019-07-08 HISTORY — DX: Post covid-19 condition, unspecified: U09.9

## 2019-07-08 NOTE — Assessment & Plan Note (Signed)
Encouraged heart healthy diet, increase exercise, avoid trans fats, consider a krill oil cap daily 

## 2019-07-08 NOTE — Patient Instructions (Addendum)
Omron Blood Pressure cuff, upper arm, want BP 100-140/60-90 Pulse oximeter, want oxygen in 90s  Weekly vitals  Take Multivitamin with minerals, selenium Vitamin D 1000-2000 IU daily Probiotic with lactobacillus and bifidophilus Asprin EC 81 mg daily Fish or Krill oil caps daily  Biotin daily   [email or web address]
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
## 2019-07-08 NOTE — Assessment & Plan Note (Signed)
hgba1c acceptable, minimize simple carbs. Increase exercise as tolerated.  

## 2019-07-08 NOTE — Assessment & Plan Note (Signed)
She is struggling with long haul symptoms now with fatigue, myalgias, hair loss, increased stress. She does endorse her breathing has improved a good deal. Still not exercising yet

## 2019-07-08 NOTE — Assessment & Plan Note (Signed)
Supplement and monitor 
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
## 2019-07-08 NOTE — Assessment & Plan Note (Signed)
Questionable in RUL in late March, repeat CXR to look for clearance

## 2019-07-10 ENCOUNTER — Encounter: Payer: Self-pay | Admitting: Family Medicine

## 2019-07-19 ENCOUNTER — Encounter: Payer: Self-pay | Admitting: Family Medicine

## 2019-09-12 ENCOUNTER — Other Ambulatory Visit: Payer: Self-pay

## 2019-09-12 ENCOUNTER — Ambulatory Visit (INDEPENDENT_AMBULATORY_CARE_PROVIDER_SITE_OTHER): Payer: Self-pay | Admitting: Family Medicine

## 2019-09-12 VITALS — BP 98/60 | HR 78 | Temp 98.6°F | Resp 12 | Ht 71.0 in | Wt 162.4 lb

## 2019-09-12 DIAGNOSIS — R0602 Shortness of breath: Secondary | ICD-10-CM

## 2019-09-12 DIAGNOSIS — F4310 Post-traumatic stress disorder, unspecified: Secondary | ICD-10-CM

## 2019-09-12 DIAGNOSIS — E559 Vitamin D deficiency, unspecified: Secondary | ICD-10-CM

## 2019-09-12 DIAGNOSIS — M5442 Lumbago with sciatica, left side: Secondary | ICD-10-CM

## 2019-09-12 DIAGNOSIS — R739 Hyperglycemia, unspecified: Secondary | ICD-10-CM

## 2019-09-12 DIAGNOSIS — U071 COVID-19: Secondary | ICD-10-CM

## 2019-09-12 DIAGNOSIS — E538 Deficiency of other specified B group vitamins: Secondary | ICD-10-CM

## 2019-09-12 DIAGNOSIS — L659 Nonscarring hair loss, unspecified: Secondary | ICD-10-CM

## 2019-09-12 MED ORDER — ALPRAZOLAM 0.25 MG PO TABS: 0.2500 mg | ORAL_TABLET | Freq: Two times a day (BID) | ORAL | 0 refills | Status: DC | PRN

## 2019-09-12 MED ORDER — ONE FLOW SPIROMETER KIT: 1.0000 | PACK | Freq: Two times a day (BID) | 0 refills | Status: AC

## 2019-09-12 NOTE — Assessment & Plan Note (Signed)
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 She had it in December but she continues to notice fatigue, tinnitus, bone and joint pain, hair loss, Olfactory hallucinations, depression. Some SOB persists and she notes some constipation since taking antibiotics. She tries to hydrate well, uses probiotic, fiber supplements.

## 2019-09-12 NOTE — Assessment & Plan Note (Signed)
hgba1c acceptable, minimize simple carbs. Increase exercise as tolerated. Continue current meds 

## 2019-09-12 NOTE — Assessment & Plan Note (Signed)
During pandemic, encouraged MV with minerals, biotin, fatty acids and zinc. Hydrate well and minimize

## 2019-09-12 NOTE — Patient Instructions (Addendum)
Magnesium Glycintate 400 mg daily  Biotin, fatty acids, multivitamins with minerals/zinc  Incentive Spirometry  Encouraged increased hydration and fiber in diet. Daily probiotics. If bowels not moving can use MOM 2 tbls po in 4 oz of warm prune juice by mouth every 2-3 days. If no results then repeat in 4 hours with  Dulcolax suppository pr, may repeat again in 4 more hours as needed. Seek care if symptoms worsen. Consider daily Miralax with Benefiber mixed in daily or tiwce dailyand/or Dulcolax if symptoms persist.   COVID-19 Frequently Asked Questions COVID-19 (coronavirus disease) is an infection that is caused by a large family of viruses. Some viruses cause illness in people and others cause illness in animals like camels, cats, and bats. In some cases, the viruses that cause illness in animals can spread to humans. Where did the coronavirus come from? In December 2019, Armeniahina told the [employer] [facility](WHO) of several cases of lung disease (human respiratory illness). These cases were linked to [place] The link to the seafood and livestock market suggests that the virus may have spread from animals to humans. However, since that first outbreak in December, the virus has also been shown to spread from person to person. What is the name of the disease and the virus? Disease name Early on, this disease was called novel coronavirus. This is because scientists determined that the disease was caused by a new (novel) respiratory virus. The World Health Organization [facility](WHO) has now named the disease COVID-19, or coronavirus disease. Virus name The virus that causes the disease is called severe acute respiratory syndrome coronavirus 2 (SARS-CoV-2). More information on disease and virus naming World Health Organization [facility](WHO):  www.who.int/emergencies/diseases/novel-coronavirus-2019/technical-guidance/naming-the-coronavirus-disease-(covid-2019)-and-the-virus-that-causes-it Who is at risk for complications from coronavirus disease? Some people may be at higher risk for complications from coronavirus disease. This includes older adults and people who have chronic diseases, such as heart disease, diabetes, and lung disease. If you are at higher risk for complications, take these extra precautions:  Stay home as much as possible.  Avoid social gatherings and travel.  Avoid close contact with others. Stay at least 6 ft (2 m) away from others, if possible.  Wash your hands often with soap and water for at least 20 seconds.  Avoid touching your face, mouth, nose, or eyes.  Keep supplies on hand at home, such as food, medicine, and cleaning supplies.  If you must go out in public, wear a cloth face covering or face mask. Make sure your mask covers your nose and mouth. How does coronavirus disease spread? The virus that causes coronavirus disease spreads easily from person to person (is contagious). You may catch the virus by:  Breathing in droplets from an infected person. Droplets can be spread by a person breathing, speaking, singing, coughing, or sneezing.  Touching something, like a table or a doorknob, that was exposed to the virus (contaminated) and then touching your mouth, nose, or eyes. Can I get the virus from touching surfaces or objects? There is still a lot that we do not know about the virus that causes coronavirus disease. Scientists are basing a lot of information on what they know about similar viruses, such as:  Viruses cannot generally survive on surfaces for long. They need a human body (host) to survive.  It is more likely that the virus is spread by close contact with people who are sick (direct contact), such as through: ? Shaking hands  or hugging. ? Breathing in respiratory droplets that  travel through the air. Droplets can be spread by a person breathing, speaking, singing, coughing, or sneezing.  It is less likely that the virus is spread when a person touches a surface or object that has the virus on it (indirect contact). The virus may be able to enter the body if the person touches a surface or object and then touches his or her face, eyes, nose, or mouth. Can a person spread the virus without having symptoms of the disease? It may be possible for the virus to spread before a person has symptoms of the disease, but this is most likely not the main way the virus is spreading. It is more likely for the virus to spread by being in close contact with people who are sick and breathing in the respiratory droplets spread by a person breathing, speaking, singing, coughing, or sneezing. What are the symptoms of coronavirus disease? Symptoms vary from person to person and can range from mild to severe. Symptoms may include:  Fever or chills.  Cough.  Difficulty breathing or feeling short of breath.  Headaches, body aches, or muscle aches.  Runny or stuffy (congested) nose.  Sore throat.  New loss of taste or smell.  Nausea, vomiting, or diarrhea. These symptoms can appear anywhere from 2 to 14 days after you have been exposed to the virus. Some people may not have any symptoms. If you develop symptoms, call your health care provider. People with severe symptoms may need hospital care. Should I be tested for this virus? Your health care provider will decide whether to test you based on your symptoms, history of exposure, and your risk factors. How does a health care provider test for this virus? Health care providers will collect samples to send for testing. Samples may include:  Taking a swab of fluid from the back of your nose and throat, your nose, or your throat.  Taking fluid from the lungs by having you cough up mucus (sputum) into a sterile cup.  Taking a blood  sample. Is there a treatment or vaccine for this virus? Currently, there is no vaccine to prevent coronavirus disease. Also, there are no medicines like antibiotics or antivirals to treat the virus. A person who becomes sick is given supportive care, which means rest and fluids. A person may also relieve his or her symptoms by using over-the-counter medicines that treat sneezing, coughing, and runny nose. These are the same medicines that a person takes for the common cold. If you develop symptoms, call your health care provider. People with severe symptoms may need hospital care. What can I do to protect myself and my family from this virus?     You can protect yourself and your family by taking the same actions that you would take to prevent the spread of other viruses. Take the following actions:  Wash your hands often with soap and water for at least 20 seconds. If soap and water are not available, use alcohol-based hand sanitizer.  Avoid touching your face, mouth, nose, or eyes.  Cough or sneeze into a tissue, sleeve, or elbow. Do not cough or sneeze into your hand or the air. ? If you cough or sneeze into a tissue, throw it away immediately and wash your hands.  Disinfect objects and surfaces that you frequently touch every day.  Stay away from people who are sick.  Avoid going out in public, follow guidance from your state and local  health authorities.  Avoid crowded indoor spaces. Stay at least 6 ft (2 m) away from others.  If you must go out in public, wear a cloth face covering or face mask. Make sure your mask covers your nose and mouth.  Stay home if you are sick, except to get medical care. Call your health care provider before you get medical care. Your health care provider will tell you how long to stay home.  Make sure your vaccines are up to date. Ask your health care provider what vaccines you need. What should I do if I need to travel? Follow travel recommendations  from your local health authority, the CDC, and WHO. Travel information and advice  Centers for Disease Control and Prevention (CDC): GeminiCard.gl  World Health Organization [facility]): PreviewDomains.se Know the risks and take action to protect your health  You are at higher risk of getting coronavirus disease if you are traveling to areas with an outbreak or if you are exposed to travelers from areas with an outbreak.  Wash your hands often and practice good hygiene to lower the risk of catching or spreading the virus. What should I do if I am sick? General instructions to stop the spread of infection  Wash your hands often with soap and water for at least 20 seconds. If soap and water are not available, use alcohol-based hand sanitizer.  Cough or sneeze into a tissue, sleeve, or elbow. Do not cough or sneeze into your hand or the air.  If you cough or sneeze into a tissue, throw it away immediately and wash your hands.  Stay home unless you must get medical care. Call your health care provider or local health authority before you get medical care.  Avoid public areas. Do not take public transportation, if possible.  If you can, wear a mask if you must go out of the house or if you are in close contact with someone who is not sick. Make sure your mask covers your nose and mouth. Keep your home clean  Disinfect objects and surfaces that are frequently touched every day. This may include: ? Counters and tables. ? Doorknobs and light switches. ? Sinks and faucets. ? Electronics such as phones, remote controls, keyboards, computers, and tablets.  Wash dishes in hot, soapy water or use a dishwasher. Air-dry your dishes.  Wash laundry in hot water. Prevent infecting other household members  Let healthy household members care for children and pets, if possible. If you have to care for children or  pets, wash your hands often and wear a mask.  Sleep in a different bedroom or bed, if possible.  Do not share personal items, such as razors, toothbrushes, deodorant, combs, brushes, towels, and washcloths. Where to find more information Centers for Disease Control and Prevention (CDC)  Information and news updates: CardRetirement.cz World Health Organization [facility])  Information and news updates: AffordableSalon.es  Coronavirus health topic: https://thompson-craig.com/  Questions and answers on COVID-19: kruiseway.com  Global tracker: who.sprinklr.com American Academy of Pediatrics (AAP)  Information for families: www.healthychildren.org/English/health-issues/conditions/chest-lungs/Pages/2019-Novel-Coronavirus.aspx The coronavirus situation is changing rapidly. Check your local health authority website or the CDC and [facility] websites for updates and news. When should I contact a health care provider?  Contact your health care provider if you have symptoms of an infection, such as fever or cough, and you: ? Have been near anyone who is known to have coronavirus disease. ? Have come into contact with a person who is suspected to have coronavirus disease. ?  Have traveled to an area where there is an outbreak of COVID-19. When should I get emergency medical care?  Get help right away by calling your local emergency services (911 in the [place] if you have: ? Trouble breathing. ? Pain or pressure in your chest. ? Confusion. ? Blue-tinged lips and fingernails. ? Difficulty waking from sleep. ? Symptoms that get worse. Let the emergency medical personnel know if you think you have coronavirus disease. Summary  A new respiratory virus is spreading from person to person and causing COVID-19 (coronavirus disease).  The virus that causes COVID-19 appears to spread easily. It spreads from one  person to another through droplets from breathing, speaking, singing, coughing, or sneezing.  Older adults and those with chronic diseases are at higher risk of disease. If you are at higher risk for complications, take extra precautions.  There is currently no vaccine to prevent coronavirus disease. There are no medicines, such as antibiotics or antivirals, to treat the virus.  You can protect yourself and your family by washing your hands often, avoiding touching your face, and covering your coughs and sneezes. This information is not intended to replace advice given to you by your health care provider. Make sure you discuss any questions you have with your health care provider. Document Revised: 12/21/2018 Document Reviewed: 06/19/2018 Elsevier Patient Education  2020 [employer].

## 2019-09-16 DIAGNOSIS — R0602 Shortness of breath: Secondary | ICD-10-CM

## 2019-09-16 HISTORY — DX: Shortness of breath: R06.02

## 2019-09-16 NOTE — Assessment & Plan Note (Signed)
Supplement and monitor 

## 2019-09-16 NOTE — Assessment & Plan Note (Signed)
She notes this is persistent since her COVID diagnosis. She is improving. She is encouraged to try deep breathing exercises sets of 10 twice a day.

## 2019-09-16 NOTE — Assessment & Plan Note (Signed)
Encouraged moist heat and gentle stretching as tolerated. May try NSAIDs and prescription meds as directed and report if symptoms worsen or seek immediate care 

## 2019-09-16 NOTE — Progress Notes (Signed)
Subjective:    Patient ID: Hailey Cochran, female    DOB: [birth year], 59 y.o.   MRN: [id number]  Chief Complaint  Patient presents with  . Follow-up    HPI Patient is in today for follow upon chronic medical concerns. She is slowly recovering from an episode of COVID and while she is improving it is slow. She continues to have notable SOB and she also notes increased anxiety due to her level of symptoms. She notes fatigue, palpitations, myalgias and chronic pain. Denies CP/HA/congestion/fevers/GI or GU c/o. Taking meds as prescribed  Past Medical History:  Diagnosis Date  . Anemia   . Cervical cancer screening 02/02/2015   Menarche at 6, stop at 7.5, restarted at 10 Regular and heavy flow and painful from endocmetriosis No history of abnormal pap in past G5P2, s/p 3 miscarriages, 2 svd No history of abnormal MGM, never had a MGM, no concerns, nursed 8 kids, declines MGM for now No concerns today  gyn surgeries: right oophorectomy for an endometrioma LMP at age 52  . Dry eyes 07/27/2016  . Elevated antinuclear antibody (ANA) level   . Elevated liver enzymes   . Endometriosis   . Fibromyalgia   . Glaucoma   . History of endometriosis   . Hyperglycemia 07/26/2016  . Lumbar herniated disc   . Overweight 09/29/2014  . Overweight 09/29/2014  . Palpitations 07/12/2016  . Premature ovarian failure    30s  . Preventative health care 02/07/2015  . 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 Vitamin B 12 deficiency     Past Surgical History:  Procedure Laterality Date  . SALPINGOOPHORECTOMY Right 1990   endometriosis    Family History  Problem Relation Age of Onset  . Dementia Mother        "lewey body"  . Leukemia Mother   . Diabetes Mother        diet controlled  . Hypertension Mother        diet controlled  . Parkinson's disease Mother   . Hepatitis C Father         2 strains  . Cancer Father        hepatocellular carcinoma  . Hypertension  Father   . Bullous pemphigoid Father   . Alcohol abuse Father        drug abuse  . Muscular dystrophy Paternal Uncle   . Other Sister        fatty liver disease  . Rickets Son   . Gallstones Maternal Aunt   . Anemia Maternal Aunt   . Diabetes Maternal Grandmother   . Dementia Maternal Grandmother   . Gallstones Maternal Grandfather   . Heart disease Maternal Grandfather        cardiomegaly  . Alzheimer's disease Paternal Grandfather   . Hypertension Paternal Grandfather   . Single kidney Daughter   . Eczema Son     Social History   Socioeconomic History  . Marital status: Married    Spouse name: Not on file  . Number of children: 8  . Years of education: Not on file  . Highest education level: Not on file  Occupational History  . Occupation: Aeronautical engineer  Tobacco Use  . Smoking status: Never Smoker  . Smokeless tobacco: Never Used  Substance and Sexual Activity  . Alcohol use: No    Alcohol/week: 0.0 standard drinks  . Drug use: No  . Sexual activity: Yes    Comment: lives  with children, husband, sister in law and GM, no dietary restrictions avoids pork and alcohol.  Other Topics Concern  . Not on file  Social History Narrative   2 biological children 54- daughter [name]                                    1- [name]   3 miscarraiges       Adopted children:   28 year old- [name]   18 year old amelia   59 year old evan michael   7 yosiah   6 jeshua   6 kaleb daniel      Married- Samuel   Homeschools her children   Enjoys Corporate investment banker, Barrister's clerk, dancing   Christian, jewish heritage   Social Determinants of Radio broadcast assistant Strain:   . Difficulty of Paying Living Expenses:   Food Insecurity:   . Worried About Charity fundraiser in the Last Year:   . Arboriculturist in the Last Year:   Transportation Needs:   . Film/video editor (Medical):   Marland Kitchen Lack of Transportation (Non-Medical):   Physical Activity:   . Days of Exercise  per Week:   . Minutes of Exercise per Session:   Stress:   . Feeling of Stress :   Social Connections:   . Frequency of Communication with Friends and Family:   . Frequency of Social Gatherings with Friends and Family:   . Attends Religious Services:   . Active Member of Clubs or Organizations:   . Attends Archivist Meetings:   Marland Kitchen Marital Status:   Intimate Partner Violence:   . Fear of Current or Ex-Partner:   . Emotionally Abused:   Marland Kitchen Physically Abused:   . Sexually Abused:     Outpatient Medications Prior to Visit  Medication Sig Dispense Refill  . albuterol (PROVENTIL HFA;VENTOLIN HFA) 108 (90 Base) MCG/ACT inhaler Inhale 2 puffs into the lungs every 6 (six) hours as needed for wheezing or shortness of breath. 18 g 5  . EPINEPHrine 0.3 mg/0.3 mL IJ SOAJ injection Inject 0.3 mLs (0.3 mg total) into the muscle as needed. 2 Device 1  . fluticasone (FLOVENT HFA) 110 MCG/ACT inhaler Inhale 2 puffs into the lungs 2 (two) times daily. 1 Inhaler 12   No facility-administered medications prior to visit.    Allergies  Allergen Reactions  . Morphine   . Other     ANTS.  Causes numbness / paralysis per pt  . Wasp Venom Other (See Comments)    Numbness / paralysis    Review of Systems  Constitutional: Positive for malaise/fatigue. Negative for fever.  HENT: Negative for congestion.   Eyes: Negative for blurred vision.  Respiratory: Negative for shortness of breath.   Cardiovascular: Negative for chest pain, palpitations and leg swelling.  Gastrointestinal: Positive for constipation. Negative for abdominal pain, blood in stool and nausea.  Genitourinary: Negative for dysuria and frequency.  Musculoskeletal: Negative for falls.  Skin: Negative for rash.  Neurological: Negative for dizziness, loss of consciousness and headaches.  Endo/Heme/Allergies: Negative for environmental allergies.  Psychiatric/Behavioral: Positive for depression. Negative for suicidal ideas. The  patient is nervous/anxious.        Objective:    Physical Exam Vitals and nursing note reviewed.  Constitutional:      General: She is not in acute distress.    Appearance: She is well-developed.  HENT:     Head: Normocephalic and atraumatic.     Nose: Nose normal.  Eyes:     General:        Right eye: No discharge.        Left eye: No discharge.  Cardiovascular:     Rate and Rhythm: Normal rate and regular rhythm.     Heart sounds: No murmur heard.   Pulmonary:     Effort: Pulmonary effort is normal.     Breath sounds: Normal breath sounds.  Abdominal:     General: Bowel sounds are normal.     Palpations: Abdomen is soft.     Tenderness: There is no abdominal tenderness.  Musculoskeletal:     Cervical back: Normal range of motion and neck supple.  Skin:    General: Skin is warm and dry.  Neurological:     Mental Status: She is alert and oriented to person, place, and time.     BP 98/60 (BP Location: Right Arm, Cuff Size: Normal)   Pulse 78   Temp 98.6 F (37 C) (Oral)   Resp 12   Ht 5' 11"  (1.803 m)   Wt 162 lb 6.4 oz (73.7 kg)   SpO2 98%   BMI 22.65 kg/m  Wt Readings from Last 3 Encounters:  09/12/19 162 lb 6.4 oz (73.7 kg)  07/08/19 163 lb (73.9 kg)  12/26/16 182 lb 3.2 oz (82.6 kg)    Diabetic Foot Exam - Simple   No data filed     Lab Results  Component Value Date   WBC 6.7 11/09/2016   HGB 14.3 11/09/2016   HCT 41.9 11/09/2016   PLT 221 11/09/2016   GLUCOSE 87 11/09/2016   CHOL 193 11/09/2016   TRIG 103 11/09/2016   HDL 56 11/09/2016   LDLCALC 116 (H) 11/09/2016   ALT 26 11/09/2016   AST 25 11/09/2016   NA 139 11/09/2016   K 3.8 11/09/2016   CL 106 11/09/2016   CREATININE 0.66 11/09/2016   BUN 14 11/09/2016   CO2 23 11/09/2016   TSH 5.13 (H) 11/09/2016   HGBA1C 5.4 11/09/2016    Lab Results  Component Value Date   TSH 5.13 (H) 11/09/2016   Lab Results  Component Value Date   WBC 6.7 11/09/2016   HGB 14.3 11/09/2016   HCT  41.9 11/09/2016   MCV 92.5 11/09/2016   PLT 221 11/09/2016   Lab Results  Component Value Date   NA 139 11/09/2016   K 3.8 11/09/2016   CO2 23 11/09/2016   GLUCOSE 87 11/09/2016   BUN 14 11/09/2016   CREATININE 0.66 11/09/2016   BILITOT 0.3 11/09/2016   ALKPHOS 106 11/09/2016   AST 25 11/09/2016   ALT 26 11/09/2016   PROT 7.7 11/09/2016   ALBUMIN 4.5 11/09/2016   CALCIUM 9.3 11/09/2016   GFR 98.29 11/09/2016   Lab Results  Component Value Date   CHOL 193 11/09/2016   Lab Results  Component Value Date   HDL 56 11/09/2016   Lab Results  Component Value Date   LDLCALC 116 (H) 11/09/2016   Lab Results  Component Value Date   TRIG 103 11/09/2016   Lab Results  Component Value Date   CHOLHDL 3.4 11/09/2016   Lab Results  Component Value Date   HGBA1C 5.4 11/09/2016       Assessment & Plan:   Problem List Items Addressed This Visit    Low back pain with left-sided sciatica  Encouraged moist heat and gentle stretching as tolerated. May try NSAIDs and prescription meds as directed and report if symptoms worsen or seek immediate care      Relevant Medications   ALPRAZolam (XANAX) 0.25 MG tablet   Vitamin D deficiency    Supplement and monitor      Vitamin B 12 deficiency    Supplement and monitor      Hyperglycemia    hgba1c acceptable, minimize simple carbs. Increase exercise as tolerated. Continue current meds      PTSD (post-traumatic stress disorder)    Very stressed and her recovery from [place] has heightened it. 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 She had it in December but she continues to notice fatigue, tinnitus, bone and joint pain, hair loss, Olfactory hallucinations, depression. Some SOB persists and she notes some constipation since taking antibiotics. She tries to hydrate well, uses probiotic,  fiber supplements.       RESOLVED: Hair loss disorder    During pandemic, encouraged MV with minerals, biotin, fatty acids and zinc. Hydrate well and minimize      SOB (shortness of breath) - Primary    She notes this is persistent since her COVID diagnosis. She is improving. She is encouraged to try deep breathing exercises sets of 10 twice a day.       Relevant Medications   Respiratory Therapy Supplies (ONE FLOW SPIROMETER) KIT      I have discontinued Walker Kehr "[name]"'s fluticasone. I am also having her start on One Flow Spirometer and ALPRAZolam. Additionally, I am having her maintain her EPINEPHrine and albuterol.  Meds ordered this encounter  Medications  . Respiratory Therapy Supplies (ONE FLOW SPIROMETER) KIT    Sig: 1 kit by Does not apply route in the morning and at bedtime.    Dispense:  1 kit    Refill:  0  . ALPRAZolam (XANAX) 0.25 MG tablet    Sig: Take 1 tablet (0.25 mg total) by mouth 2 (two) times daily as needed for anxiety.    Dispense:  20 tablet    Refill:  0     Penni Homans, MD

## 2019-09-16 NOTE — Assessment & Plan Note (Signed)
Very stressed and her recovery from COVID has heightened it. She can use Alprazolam prn and maintain counseling

## 2019-10-09 ENCOUNTER — Other Ambulatory Visit: Payer: Self-pay | Admitting: Family Medicine

## 2019-10-09 ENCOUNTER — Encounter: Payer: Self-pay | Admitting: Family Medicine

## 2020-10-04 ENCOUNTER — Encounter: Payer: Self-pay | Admitting: Family Medicine

## 2020-10-05 MED ORDER — ALBUTEROL SULFATE HFA 108 (90 BASE) MCG/ACT IN AERS: 2.0000 | INHALATION_SPRAY | Freq: Four times a day (QID) | RESPIRATORY_TRACT | 0 refills | Status: DC | PRN

## 2020-10-05 NOTE — Addendum Note (Signed)
Addended by: Thelma Barge D on: 10/05/2020 10:43 AM   Modules accepted: Orders

## 2020-12-30 IMAGING — DX DG CHEST 2V
2 series · 2 of 2 positions shown · non-contrast
Comparison: None.

CLINICAL DATA: Cough.

EXAM:
CHEST - 2 VIEW

[chest pa]
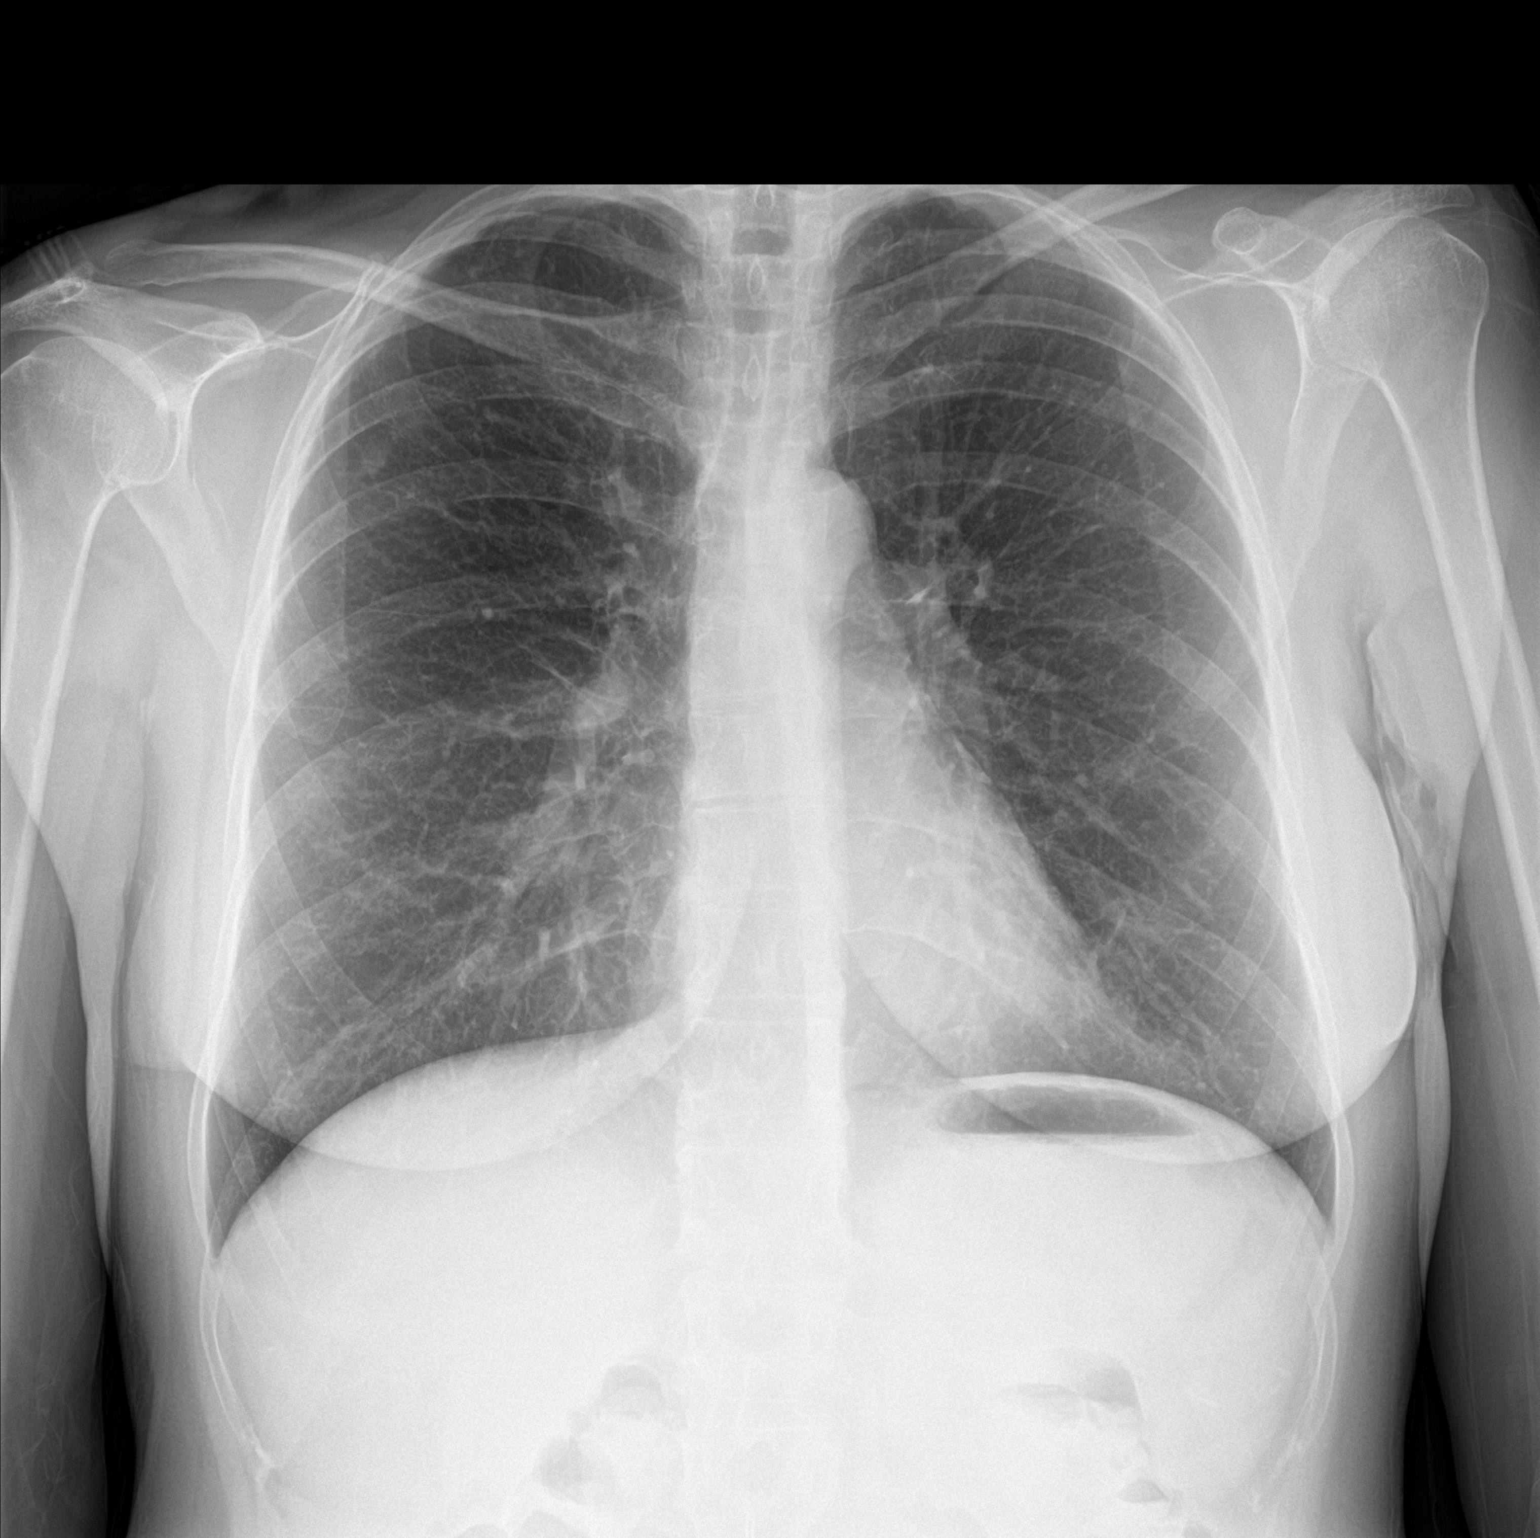

[chest lat]
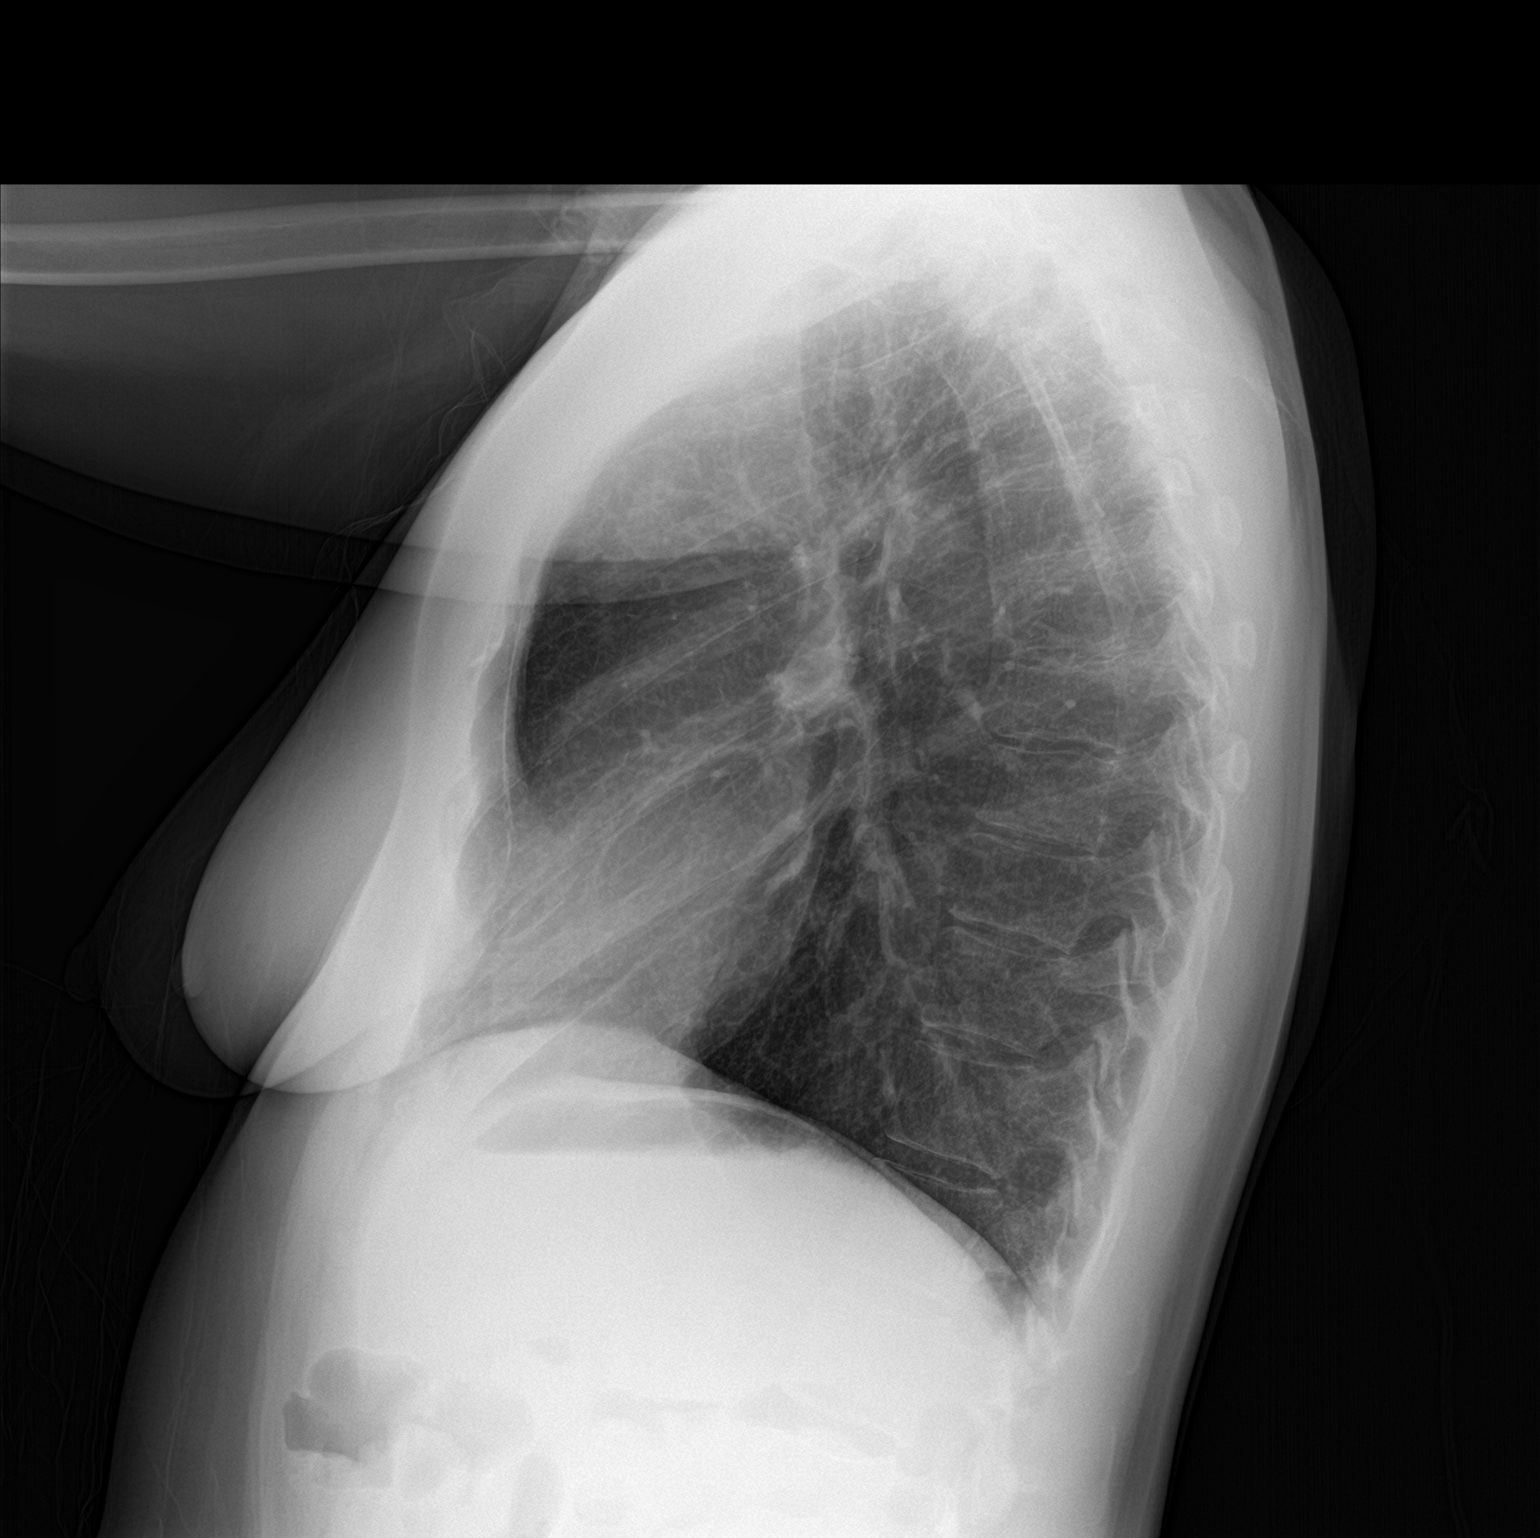

[2 of 2 positions shown; findings below may reference images not displayed]

FINDINGS: There is a questionable pulmonary nodule in the right upper lobe
projecting between the second and third ribs anteriorly on the
right. The heart size is normal. There is no pneumothorax or large
pleural effusion. No focal infiltrate. No acute osseous abnormality.
IMPRESSION: 1. No acute cardiopulmonary process.
2. Questionable pulmonary nodule involving the right upper lobe. A
4-6 week follow-up two-view chest x-ray is recommended for further
evaluation of this finding.

## 2021-02-25 ENCOUNTER — Other Ambulatory Visit: Payer: Self-pay | Admitting: Family Medicine

## 2021-05-14 ENCOUNTER — Encounter: Payer: Self-pay | Admitting: Family Medicine
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
## 2021-06-15 ENCOUNTER — Ambulatory Visit (INDEPENDENT_AMBULATORY_CARE_PROVIDER_SITE_OTHER): Payer: Self-pay | Admitting: Family Medicine

## 2021-06-15 ENCOUNTER — Encounter: Payer: Self-pay | Admitting: Family Medicine

## 2021-06-15 VITALS — BP 104/70 | HR 71 | Resp 20 | Ht 71.0 in | Wt 203.4 lb

## 2021-06-15 DIAGNOSIS — D649 Anemia, unspecified: Secondary | ICD-10-CM

## 2021-06-15 DIAGNOSIS — E559 Vitamin D deficiency, unspecified: Secondary | ICD-10-CM

## 2021-06-15 DIAGNOSIS — E785 Hyperlipidemia, unspecified: Secondary | ICD-10-CM

## 2021-06-15 DIAGNOSIS — F4310 Post-traumatic stress disorder, unspecified: Secondary | ICD-10-CM

## 2021-06-15 DIAGNOSIS — R739 Hyperglycemia, unspecified: Secondary | ICD-10-CM

## 2021-06-15 DIAGNOSIS — E538 Deficiency of other specified B group vitamins: Secondary | ICD-10-CM

## 2021-06-15 DIAGNOSIS — U099 Post covid-19 condition, unspecified: Secondary | ICD-10-CM

## 2021-06-15 DIAGNOSIS — E079 Disorder of thyroid, unspecified: Secondary | ICD-10-CM

## 2021-06-15 MED ORDER — BUSPIRONE HCL 5 MG PO TABS: 5.0000 mg | ORAL_TABLET | Freq: Three times a day (TID) | ORAL | 3 refills | Status: DC

## 2021-06-15 MED ORDER — FLUTICASONE PROPIONATE HFA 110 MCG/ACT IN AERO: 2.0000 | INHALATION_SPRAY | Freq: Two times a day (BID) | RESPIRATORY_TRACT | 1 refills | Status: DC

## 2021-06-15 NOTE — Patient Instructions (Signed)
Follow up with colonoscopy and mammogram as soon as possible.  ?

## 2021-06-15 NOTE — Assessment & Plan Note (Signed)
Supplement and monitor 

## 2021-06-15 NOTE — Assessment & Plan Note (Addendum)
Increased bone pain, fatigue, headaches, smells are different, sound sensitivity. She notes first infection was April 2020 and then second case this past 6 months. She is encouraged to eat a low inflammation, heart healthy diet, increase exercise, take a multivitamin with minerals daily. She notes Flovent helps her with her breathing. Refill given today ?
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
## 2021-06-16 NOTE — Assessment & Plan Note (Signed)
hgba1c acceptable, minimize simple carbs. Increase exercise as tolerated.  

## 2021-06-16 NOTE — Assessment & Plan Note (Signed)
Encourage heart healthy diet such as MIND or DASH diet, increase exercise, avoid trans fats, simple carbohydrates and processed foods, consider a krill or fish or flaxseed oil cap daily.  Tolerating Rosuvastatin 

## 2021-07-20 ENCOUNTER — Ambulatory Visit: Payer: Self-pay | Admitting: Family Medicine

## 2021-08-26 ENCOUNTER — Ambulatory Visit: Payer: Self-pay | Admitting: Family Medicine

## 2021-09-13 NOTE — Progress Notes (Unsigned)
Subjective:    Patient ID: Hailey Cochran, female    DOB: 17-Dec-1960, 61 y.o.   MRN: [id number]  No chief complaint on file.   HPI Patient is in today for a follow up.   Past Medical History:  Diagnosis Date   Anemia    Cervical cancer screening 02/02/2015   Menarche at 6, stop at 7.5, restarted at 10 Regular and heavy flow and painful from endocmetriosis No history of abnormal pap in past G5P2, s/p 3 miscarriages, 2 svd No history of abnormal MGM, never had a MGM, no concerns, nursed 8 kids, declines MGM for now No concerns today  gyn surgeries: right oophorectomy for an endometrioma LMP at age 18   Dry eyes 07/27/2016   Elevated antinuclear antibody (ANA) level    Elevated liver enzymes    Endometriosis    Fibromyalgia    Glaucoma    History of endometriosis    Hyperglycemia 07/26/2016   Lumbar herniated disc    Overweight 09/29/2014   Overweight 09/29/2014   Palpitations 07/12/2016   Premature ovarian failure    30s   Preventative health care 02/07/2015   Thyroid disease    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 [facility] Thyroid   Vitamin B 12 deficiency     Past Surgical History:  Procedure Laterality Date   SALPINGOOPHORECTOMY Right 1990   endometriosis    Family History  Problem Relation Age of Onset   Dementia Mother        "lewey body"   Leukemia Mother    Diabetes Mother        diet controlled   Hypertension Mother        diet controlled   Parkinson's disease Mother    Hepatitis C Father         2 strains   Cancer Father        hepatocellular carcinoma   Hypertension Father    Bullous pemphigoid Father    Alcohol abuse Father        drug abuse   Muscular dystrophy Paternal Uncle    Other Sister        fatty liver disease   Rickets Son    Gallstones Maternal Aunt    Anemia Maternal Aunt    Diabetes Maternal Grandmother    Dementia Maternal Grandmother    Gallstones Maternal Grandfather    Heart disease Maternal Grandfather        cardiomegaly    Alzheimer's disease Paternal Grandfather    Hypertension Paternal Grandfather    Single kidney Daughter    Eczema Son     Social History   Socioeconomic History   Marital status: Legally Separated    Spouse name: Not on file   Number of children: 8   Years of education: Not on file   Highest education level: Not on file  Occupational History   Occupation: Aeronautical engineer  Tobacco Use   Smoking status: Never   Smokeless tobacco: Never  Substance and Sexual Activity   Alcohol use: No    Alcohol/week: 0.0 standard drinks of alcohol   Drug use: No   Sexual activity: Yes    Comment: lives with children, husband, sister in law and GM, no dietary restrictions avoids pork and alcohol.  Other Topics Concern   Not on file  Social History Narrative   2 biological children 70- daughter Hailey Cochran  [place]   3 miscarraiges       Adopted children:   72 year old- Hailey Cochran   61 year old Hailey Cochran   61 year old Hailey Cochran   7 Hailey Cochran   6 Hailey Cochran   6 Hailey Cochran      Married- Hailey Cochran   Homeschools her children   Enjoys Corporate investment banker, Barrister's clerk, dancing   Christian, jewish heritage   Social Determinants of Radio broadcast assistant Strain: Not on Art therapist Insecurity: Not on file  Transportation Needs: Not on file  Physical Activity: Not on file  Stress: Not on file  Social Connections: Not on file  Intimate Partner Violence: Not on file    Outpatient Medications Prior to Visit  Medication Sig Dispense Refill   albuterol (VENTOLIN HFA) 108 (90 Base) MCG/ACT inhaler Inhale 2 puffs into the lungs every 6 (six) hours as needed for wheezing or shortness of breath. 18 g 0   busPIRone (BUSPAR) 5 MG tablet Take 1 tablet (5 mg total) by mouth 3 (three) times daily. 90 tablet 3   EPINEPHrine 0.3 mg/0.3 mL IJ SOAJ injection Inject 0.3 mLs (0.3 mg total) into the muscle as needed. 2 Device 1   fluticasone (FLOVENT HFA) 110 MCG/ACT inhaler Inhale 2  puffs into the lungs 2 (two) times daily. 1 each 1   Respiratory Therapy Supplies (ONE FLOW SPIROMETER) KIT 1 kit by Does not apply route in the morning and at bedtime. 1 kit 0   No facility-administered medications prior to visit.    Allergies  Allergen Reactions   Morphine    Other     ANTS.  Causes numbness / paralysis per pt   Wasp Venom Other (See Comments)    Numbness / paralysis    ROS     Objective:    Physical Exam  There were no vitals taken for this visit. Wt Readings from Last 3 Encounters:  06/15/21 203 lb 6.4 oz (92.3 kg)  09/12/19 162 lb 6.4 oz (73.7 kg)  07/08/19 163 lb (73.9 kg)    Diabetic Foot Exam - Simple   No data filed    Lab Results  Component Value Date   WBC 6.7 11/09/2016   HGB 14.3 11/09/2016   HCT 41.9 11/09/2016   PLT 221 11/09/2016   GLUCOSE 87 11/09/2016   CHOL 193 11/09/2016   TRIG 103 11/09/2016   HDL 56 11/09/2016   LDLCALC 116 (H) 11/09/2016   ALT 26 11/09/2016   AST 25 11/09/2016   NA 139 11/09/2016   K 3.8 11/09/2016   CL 106 11/09/2016   CREATININE 0.66 11/09/2016   BUN 14 11/09/2016   CO2 23 11/09/2016   TSH 5.13 (H) 11/09/2016   HGBA1C 5.4 11/09/2016    Lab Results  Component Value Date   TSH 5.13 (H) 11/09/2016   Lab Results  Component Value Date   WBC 6.7 11/09/2016   HGB 14.3 11/09/2016   HCT 41.9 11/09/2016   MCV 92.5 11/09/2016   PLT 221 11/09/2016   Lab Results  Component Value Date   NA 139 11/09/2016   K 3.8 11/09/2016   CO2 23 11/09/2016   GLUCOSE 87 11/09/2016   BUN 14 11/09/2016   CREATININE 0.66 11/09/2016   BILITOT 0.3 11/09/2016   ALKPHOS 106 11/09/2016   AST 25 11/09/2016   ALT 26 11/09/2016   PROT 7.7 11/09/2016   ALBUMIN 4.5 11/09/2016   CALCIUM 9.3 11/09/2016   GFR 98.29 11/09/2016  Lab Results  Component Value Date   CHOL 193 11/09/2016   Lab Results  Component Value Date   HDL 56 11/09/2016   Lab Results  Component Value Date   LDLCALC 116 (H) 11/09/2016   Lab  Results  Component Value Date   TRIG 103 11/09/2016   Lab Results  Component Value Date   CHOLHDL 3.4 11/09/2016   Lab Results  Component Value Date   HGBA1C 5.4 11/09/2016       Assessment & Plan:      Problem List Items Addressed This Visit   None   I am having Hailey Cochran "Hailey Cochran" maintain her EPINEPHrine, One Flow Spirometer, albuterol, busPIRone, and fluticasone.  No orders of the defined types were placed in this encounter.

## 2021-09-14 ENCOUNTER — Telehealth (INDEPENDENT_AMBULATORY_CARE_PROVIDER_SITE_OTHER): Payer: Self-pay | Admitting: Family Medicine

## 2021-09-14 ENCOUNTER — Encounter: Payer: Self-pay | Admitting: Family Medicine

## 2021-09-14 DIAGNOSIS — R0602 Shortness of breath: Secondary | ICD-10-CM

## 2021-09-14 DIAGNOSIS — F909 Attention-deficit hyperactivity disorder, unspecified type: Secondary | ICD-10-CM

## 2021-09-14 DIAGNOSIS — E785 Hyperlipidemia, unspecified: Secondary | ICD-10-CM

## 2021-09-14 DIAGNOSIS — D649 Anemia, unspecified: Secondary | ICD-10-CM

## 2021-09-14 DIAGNOSIS — R7989 Other specified abnormal findings of blood chemistry: Secondary | ICD-10-CM

## 2021-09-14 DIAGNOSIS — E538 Deficiency of other specified B group vitamins: Secondary | ICD-10-CM

## 2021-09-14 DIAGNOSIS — F840 Autistic disorder: Secondary | ICD-10-CM

## 2021-09-14 DIAGNOSIS — E079 Disorder of thyroid, unspecified: Secondary | ICD-10-CM

## 2021-09-14 DIAGNOSIS — F4310 Post-traumatic stress disorder, unspecified: Secondary | ICD-10-CM

## 2021-09-14 DIAGNOSIS — G629 Polyneuropathy, unspecified: Secondary | ICD-10-CM | POA: Insufficient documentation

## 2021-09-14 DIAGNOSIS — U099 Post covid-19 condition, unspecified: Secondary | ICD-10-CM

## 2021-09-14 DIAGNOSIS — E559 Vitamin D deficiency, unspecified: Secondary | ICD-10-CM

## 2021-09-14 DIAGNOSIS — R739 Hyperglycemia, unspecified: Secondary | ICD-10-CM

## 2021-09-14 DIAGNOSIS — G6289 Other specified polyneuropathies: Secondary | ICD-10-CM

## 2021-09-14 HISTORY — DX: Attention-deficit hyperactivity disorder, unspecified type: F90.9

## 2021-09-14 HISTORY — DX: Autistic disorder: F84.0

## 2021-09-14 HISTORY — DX: Polyneuropathy, unspecified: G62.9

## 2021-09-14 MED ORDER — BUSPIRONE HCL 5 MG PO TABS: 5.0000 mg | ORAL_TABLET | Freq: Three times a day (TID) | ORAL | 3 refills | Status: DC

## 2021-09-14 NOTE — Assessment & Plan Note (Signed)
Supplement and monitor 

## 2021-09-14 NOTE — Assessment & Plan Note (Signed)
Minimize simple carbs, monitor

## 2021-09-14 NOTE — Assessment & Plan Note (Signed)
She is doing well and has not needed her Albuterol recently. No significant trouble with congestion.

## 2021-09-14 NOTE — Assessment & Plan Note (Signed)
Encourage heart healthy diet such as MIND or DASH diet, increase exercise, avoid trans fats, simple carbohydrates and processed foods, consider a krill or fish or flaxseed oil cap daily.  °

## 2021-09-14 NOTE — Assessment & Plan Note (Signed)
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 She notes it makes sense for her. She will get [place] the paperwork. She is also diagnosed with anxiety, OCD, PTSD and now ADHD.

## 2021-09-14 NOTE — Assessment & Plan Note (Signed)
Continue to monitor

## 2021-09-14 NOTE — Assessment & Plan Note (Signed)
She has noted sensitivity to smells since COVID, sometimes can not smell and other days it will linger and stick with her. Also struggles with long term pain with fibromyalgia etc but she notes her pain can be in the bones now and she also notes some trouble with burning

## 2021-09-14 NOTE — Patient Instructions (Addendum)
[employer] multivitamin daily Olli multivitamin  CBD daily extra strength menthol topically may help Raynaud's Phenomenon  Raynaud's phenomenon is a condition that affects the blood vessels (arteries) that carry blood to the fingers and toes. The arteries that supply blood to the ears, lips, nipples, or the tip of the nose might also be affected. Raynaud's phenomenon causes the arteries to become narrow temporarily (spasm). As a result, the flow of blood to the affected areas is temporarily decreased. This usually occurs in response to cold temperatures or stress. During an attack, the skin in the affected areas turns white, then blue, and finally red. A person may also feel tingling or numbness in those areas. Attacks usually last for only a brief period, and then the blood flow to the area returns to normal. In most cases, Raynaud's phenomenon does not cause serious health problems. What are the causes? In many cases, the cause of this condition is not known. The condition may occur on its own (primary Raynaud's phenomenon) or may be associated with other diseases or factors (secondary Raynaud's phenomenon). Possible causes may include: Diseases or medical conditions that damage the arteries. Injuries and repetitive actions that hurt the hands or feet. Being exposed to certain chemicals. Taking medicines that narrow the arteries. Other medical conditions, such as lupus, scleroderma, rheumatoid arthritis, thyroid problems, blood disorders, Sjogren syndrome, or atherosclerosis. What increases the risk? The following factors may make you more likely to develop this condition: Being 78-31 years old. Being female. Having a family history of Raynaud's phenomenon. Living in a cold climate. Smoking. What are the signs or symptoms? Symptoms of this condition usually occur when you are exposed to cold temperatures or when you have emotional stress. The symptoms may last for a few minutes or up to  several hours. They usually affect your fingers but may also affect your toes, nipples, lips, ears, or the tip of your nose. Symptoms may include: Changes in skin color. The skin in the affected areas will turn pale or white. The skin may then change from white to bluish to red as normal blood flow returns to the area. Numbness, tingling, or pain in the affected areas. In severe cases, symptoms may include: Skin sores. Tissues decaying and dying (gangrene). How is this diagnosed? This condition may be diagnosed based on: Your symptoms and medical history. A physical exam. During the exam, you may be asked to put your hands in cold water to check for a reaction to cold temperature. Tests, such as: Blood tests to check for other diseases or conditions. A test to check the movement of blood through your arteries and veins (vascular ultrasound). A test in which the skin at the base of your fingernail is examined under a microscope (nailfold capillaroscopy). How is this treated? During an episode, you can take actions to help symptoms go away faster. Options include moving your arms around in a windmill pattern, warming your fingers under warm water, or placing your fingers in a warm body fold, such as your armpit. Long-term treatment for this condition often involves making lifestyle changes and taking steps to control your exposure to cold temperature. For more severe cases, medicine (calcium channel blockers) may be used to improve blood circulation. Follow these instructions at home: Avoiding cold temperatures Take these steps to avoid exposure to cold: If possible, stay indoors during cold weather. When you go outside during cold weather, dress in layers and wear mittens, a hat, a scarf, and warm footwear. Wear  mittens or gloves when handling ice or frozen food. Use holders for glasses or cans containing cold drinks. Let warm water run for a while before taking a shower or bath. Warm up the  car before driving in cold weather. Lifestyle If possible, avoid stressful and emotional situations. Try to find ways to manage your stress, such as: Exercise. Yoga. Meditation. Biofeedback. Do not use any products that contain nicotine or tobacco. These products include cigarettes, chewing tobacco, and vaping devices, such as e-cigarettes. If you need help quitting, ask your health care provider. Avoid secondhand smoke. Limit your use of caffeine. Switch to decaffeinated coffee, tea, and soda. Avoid chocolate. Avoid vibrating tools and machinery. General instructions Protect your hands and feet from injuries, cuts, or bruises. Avoid wearing tight rings or wristbands. Wear loose fitting socks and comfortable, roomy shoes. Take over-the-counter and prescription medicines only as told by your health care provider. Where to find support Raynaud's Association: www.raynauds.org Where to find more information [employer] of Arthritis and Musculoskeletal and Skin Diseases: www.niams.http://www.myers.net/ Contact a health care provider if: Your discomfort becomes worse despite lifestyle changes. You develop sores on your fingers or toes that do not heal. You have breaks in the skin on your fingers or toes. You have a fever. You have pain or swelling in your joints. You have a rash. Your symptoms occur on only one side of your body. Get help right away if: Your fingers or toes turn black. You have severe pain in the affected areas. These symptoms may represent a serious problem that is an emergency. Do not wait to see if the symptoms will go away. Get medical help right away. Call your local emergency services (911 in the [place] Do not drive yourself to the hospital. Summary Raynaud's phenomenon is a condition that affects the arteries that carry blood to the fingers, toes, ears, lips, nipples, or the tip of the nose. In many cases, the cause of this condition is not known. Symptoms of this  condition include changes in skin color along with numbness and tingling in the affected area. Treatment for this condition includes lifestyle changes and reducing exposure to cold temperatures. Medicines may be used for severe cases of the condition. Contact your health care provider if your condition worsens despite treatment. This information is not intended to replace advice given to you by your health care provider. Make sure you discuss any questions you have with your health care provider. Document Revised: 04/28/2020 Document Reviewed: 04/28/2020 Elsevier Patient Education  2023 [employer] Peripheral Neuropathy Peripheral neuropathy is a type of nerve damage. It affects nerves that carry signals between the spinal cord and the arms, legs, and the rest of the body (peripheral nerves). It does not affect nerves in the spinal cord or brain. In peripheral neuropathy, one nerve or a group of nerves may be damaged. Peripheral neuropathy is a broad category that includes many specific nerve disorders, like diabetic neuropathy, hereditary neuropathy, and carpal tunnel syndrome. What are the causes? This condition may be caused by: Certain diseases, such as: Diabetes. This is the most common cause of peripheral neuropathy. Autoimmune diseases, such as rheumatoid arthritis and systemic lupus erythematosus. Nerve diseases that are passed from parent to child (inherited). Kidney disease. Thyroid disease. Other causes may include: Nerve injury. Pressure or stress on a nerve that lasts a long time. Lack (deficiency) of B vitamins. This can result from alcoholism, poor diet, or a restricted diet. Infections. Some medicines, such as cancer medicines (chemotherapy).  Poisonous (toxic) substances, such as lead and mercury. Too little blood flowing to the legs. In some cases, the cause of this condition is not known. What are the signs or symptoms? Symptoms of this condition depend on which of your  nerves is damaged. Symptoms in the legs, hands, and arms can include: Loss of feeling (numbness) in the feet, hands, or both. Tingling in the feet, hands, or both. Burning pain. Very sensitive skin. Weakness. Not being able to move a part of the body (paralysis). Clumsiness or poor coordination. Muscle twitching. Loss of balance. Symptoms in other parts of the body can include: Not being able to control your bladder. Feeling dizzy. Sexual problems. How is this diagnosed? Diagnosing and finding the cause of peripheral neuropathy can be difficult. Your health care provider will take your medical history and do a physical exam. A neurological exam will also be done. This involves checking things that are affected by your brain, spinal cord, and nerves (nervous system). For example, your health care provider will check your reflexes, how you move, and what you can feel. You may have other tests, such as: Blood tests. Electromyogram (EMG) and nerve conduction tests. These tests check nerve function and how well the nerves are controlling the muscles. Imaging tests, such as a CT scan or MRI, to rule out other causes of your symptoms. Removing a small piece of nerve to be examined in a lab (nerve biopsy). Removing and examining a small amount of the fluid that surrounds the brain and spinal cord (lumbar puncture). How is this treated? Treatment for this condition may involve: Treating the underlying cause of the neuropathy, such as diabetes, kidney disease, or vitamin deficiencies. Stopping medicines that can cause neuropathy, such as chemotherapy. Medicine to help relieve pain. Medicines may include: Prescription or over-the-counter pain medicine. Anti-seizure medicine. Antidepressants. Pain-relieving patches that are applied to painful areas of skin. Surgery to relieve pressure on a nerve or to destroy a nerve that is causing pain. Physical therapy to help improve movement and  balance. Devices to help you move around (assistive devices). Follow these instructions at home: Medicines Take over-the-counter and prescription medicines only as told by your health care provider. Do not take any other medicines without first asking your health care provider. Ask your health care provider if the medicine prescribed to you requires you to avoid driving or using machinery. Lifestyle  Do not use any products that contain nicotine or tobacco. These products include cigarettes, chewing tobacco, and vaping devices, such as e-cigarettes. Smoking keeps blood from reaching damaged nerves. If you need help quitting, ask your health care provider. Avoid or limit alcohol. Too much alcohol can cause a vitamin B deficiency, and vitamin B is needed for healthy nerves. Eat a healthy diet. This includes: Eating foods that are high in fiber, such as beans, whole grains, and fresh fruits and vegetables. Limiting foods that are high in fat and processed sugars, such as fried or sweet foods. General instructions  If you have diabetes, work closely with your health care provider to keep your blood sugar under control. If you have numbness in your feet: Check every day for signs of injury or infection. Watch for redness, warmth, and swelling. Wear padded socks and comfortable shoes. These help protect your feet. Develop a good support system. Living with peripheral neuropathy can be stressful. Consider talking with a mental health specialist or joining a support group. Use assistive devices and attend physical therapy as told by your health  care provider. This may include using a walker or a cane. Keep all follow-up visits. This is important. Where to find more information [employer] of Neurological Disorders: [employer] Contact a health care provider if: You have new signs or symptoms of peripheral neuropathy. You are struggling emotionally from dealing with peripheral  neuropathy. Your pain is not well controlled. Get help right away if: You have an injury or infection that is not healing normally. You develop new weakness in an arm or leg. You have fallen or do so frequently. Summary Peripheral neuropathy is when the nerves in the arms or legs are damaged, resulting in numbness, weakness, or pain. There are many causes of peripheral neuropathy, including diabetes, pinched nerves, vitamin deficiencies, autoimmune disease, and hereditary conditions. Diagnosing and finding the cause of peripheral neuropathy can be difficult. Your health care provider will take your medical history, do a physical exam, and do tests, including blood tests and nerve function tests. Treatment involves treating the underlying cause of the neuropathy and taking medicines to help control pain. Physical therapy and assistive devices may also help. This information is not intended to replace advice given to you by your health care provider. Make sure you discuss any questions you have with your health care provider. Document Revised: 10/27/2020 Document Reviewed: 10/27/2020 Elsevier Patient Education  2023 [employer].

## 2021-09-14 NOTE — Assessment & Plan Note (Signed)
Notes intermittent burning in hands, feet, scalp unclear pattern but she does think it is worse since covid. She will let us know if it gets bad enough to consider taking meds.

## 2021-09-14 NOTE — Assessment & Plan Note (Signed)
hgba1c acceptable, minimize simple carbs. Increase exercise as tolerated.  

## 2021-09-14 NOTE — Assessment & Plan Note (Signed)
Was diagnosed at the same time she was diagnosed with Autism SD

## 2021-10-04 ENCOUNTER — Ambulatory Visit: Payer: Self-pay | Admitting: Family Medicine

## 2021-11-03 ENCOUNTER — Other Ambulatory Visit: Payer: Self-pay

## 2021-11-03 MED ORDER — BUSPIRONE HCL 5 MG PO TABS: 5.0000 mg | ORAL_TABLET | Freq: Three times a day (TID) | ORAL | 3 refills | Status: AC

## 2023-03-08 HISTORY — PX: CATARACT EXTRACTION, BILATERAL: SHX1313

## 2023-04-21 ENCOUNTER — Other Ambulatory Visit: Payer: Self-pay | Admitting: Family Medicine

## 2023-04-21 MED ORDER — ALBUTEROL SULFATE HFA 108 (90 BASE) MCG/ACT IN AERS: 2.0000 | INHALATION_SPRAY | Freq: Four times a day (QID) | RESPIRATORY_TRACT | 0 refills | Status: AC | PRN

## 2023-05-12 DIAGNOSIS — F4312 Post-traumatic stress disorder, chronic: Secondary | ICD-10-CM | POA: Diagnosis not present

## 2023-05-19 DIAGNOSIS — F4312 Post-traumatic stress disorder, chronic: Secondary | ICD-10-CM | POA: Diagnosis not present

## 2023-05-26 DIAGNOSIS — F4312 Post-traumatic stress disorder, chronic: Secondary | ICD-10-CM | POA: Diagnosis not present

## 2023-06-02 DIAGNOSIS — F4312 Post-traumatic stress disorder, chronic: Secondary | ICD-10-CM | POA: Diagnosis not present

## 2023-06-08 ENCOUNTER — Encounter: Payer: Self-pay | Admitting: Family Medicine

## 2023-06-08 ENCOUNTER — Ambulatory Visit: Payer: Self-pay | Admitting: Family Medicine

## 2023-06-08 ENCOUNTER — Ambulatory Visit: Attending: Family Medicine

## 2023-06-08 VITALS — Ht 71.0 in | Wt 214.0 lb

## 2023-06-08 DIAGNOSIS — R002 Palpitations: Secondary | ICD-10-CM | POA: Diagnosis not present

## 2023-06-08 DIAGNOSIS — R42 Dizziness and giddiness: Secondary | ICD-10-CM

## 2023-06-08 DIAGNOSIS — R5383 Other fatigue: Secondary | ICD-10-CM

## 2023-06-08 DIAGNOSIS — Z0000 Encounter for general adult medical examination without abnormal findings: Secondary | ICD-10-CM

## 2023-06-08 DIAGNOSIS — E559 Vitamin D deficiency, unspecified: Secondary | ICD-10-CM | POA: Diagnosis not present

## 2023-06-08 LAB — CBC WITH DIFFERENTIAL/PLATELET
Basophils Absolute: 0.1 10*3/uL (ref 0.0–0.1)
Basophils Relative: 0.8 % (ref 0.0–3.0)
Eosinophils Absolute: 0.2 10*3/uL (ref 0.0–0.7)
Eosinophils Relative: 3.7 % (ref 0.0–5.0)
HCT: 41 % (ref 36.0–46.0)
Hemoglobin: 13.7 g/dL (ref 12.0–15.0)
Lymphocytes Relative: 22.1 % (ref 12.0–46.0)
Lymphs Abs: 1.5 10*3/uL (ref 0.7–4.0)
MCHC: 33.5 g/dL (ref 30.0–36.0)
MCV: 96.8 fl (ref 78.0–100.0)
Monocytes Absolute: 0.5 10*3/uL (ref 0.1–1.0)
Monocytes Relative: 7.8 % (ref 3.0–12.0)
Neutro Abs: 4.4 10*3/uL (ref 1.4–7.7)
Neutrophils Relative %: 65.6 % (ref 43.0–77.0)
Platelets: 229 10*3/uL (ref 150.0–400.0)
RBC: 4.23 Mil/uL (ref 3.87–5.11)
RDW: 13.2 % (ref 11.5–15.5)
WBC: 6.7 10*3/uL (ref 4.0–10.5)

## 2023-06-08 LAB — COMPREHENSIVE METABOLIC PANEL WITH GFR
ALT: 46 U/L — ABNORMAL HIGH (ref 0–35)
AST: 29 U/L (ref 0–37)
Albumin: 4.3 g/dL (ref 3.5–5.2)
Alkaline Phosphatase: 113 U/L (ref 39–117)
BUN: 19 mg/dL (ref 6–23)
CO2: 27 meq/L (ref 19–32)
Calcium: 9 mg/dL (ref 8.4–10.5)
Chloride: 105 meq/L (ref 96–112)
Creatinine, Ser: 0.79 mg/dL (ref 0.40–1.20)
GFR: 79.81 mL/min (ref >60.00)
Glucose, Bld: 95 mg/dL (ref 70–99)
Potassium: 4.3 meq/L (ref 3.5–5.1)
Sodium: 139 meq/L (ref 135–145)
Total Bilirubin: 0.4 mg/dL (ref 0.2–1.2)
Total Protein: 7 g/dL (ref 6.0–8.3)

## 2023-06-08 LAB — TSH: TSH: 3.92 u[IU]/mL (ref 0.35–5.50)

## 2023-06-08 LAB — VITAMIN D 25 HYDROXY (VIT D DEFICIENCY, FRACTURES): VITD: 19.84 ng/mL — ABNORMAL LOW (ref 30.00–100.00)

## 2023-06-08 LAB — IBC + FERRITIN
Ferritin: 159.2 ng/mL (ref 10.0–291.0)
Iron: 115 ug/dL (ref 42–145)
Saturation Ratios: 36.3 % (ref 20.0–50.0)
TIBC: 316.4 ug/dL (ref 250.0–450.0)
Transferrin: 226 mg/dL (ref 212.0–360.0)

## 2023-06-08 LAB — B12 AND FOLATE PANEL
Folate: 10.6 ng/mL (ref >5.9)
Vitamin B-12: 515 pg/mL (ref 211–911)

## 2023-06-08 LAB — EKG 12-LEAD

## 2023-06-08 NOTE — Progress Notes (Unsigned)
 EP to read.

## 2023-06-08 NOTE — Progress Notes (Signed)
 Acute Office Visit  Subjective:     Patient ID: Hailey Cochran, female    DOB: 06/04/1960, 63 y.o.   MRN: [id number]  Chief Complaint  Patient presents with   Medical Management of Chronic Issues   Hypertension    HPI Patient is in today for ongoing dizziness.  Discussed the use of AI scribe software for clinical note transcription with the patient, who gave verbal consent to proceed.  History of Present Illness Hailey Cochran "Hailey Cochran" is a 63 year old female with dizziness and fibromyalgia who presents with worsening dizziness and blood pressure fluctuations.   She has experienced dizziness throughout her life, which worsens during periods of sickness, high stress, or heat. The sensation is described as feeling like a 'vacuum cleaner just comes like to your foot and just sucks your life out,' accompanied by nausea, cold and clammy hands, and palpitations. These episodes occur frequently, especially when changing positions, and her children are aware of her need to sit or lie down during these episodes. Recently, severe stress due to a past relationship has exacerbated her symptoms. A recent episode involved her blood pressure reaching 150/80s, whereas her baseline is typically around 90/60s. She uses her late grandmother's blood pressure machine to monitor these changes. She has a history of anemia and vitamin B and D deficiencies, but her blood sugar levels have always been normal.  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 She experiences random stabbing pains and has had these symptoms for a long time. A recent recurrence of COVID-19 led to a temporary loss of smell and taste.  She experiences chronic fatigue and describes her baseline as always being fatigued. She experiences vertigo-like symptoms but current  dizzy episodes do not feel like vertigo to her - no spinning, just feeling dizzy/lightheaded.  She has a history of asthma, which occasionally causes trouble breathing, but she does not experience new respiratory symptoms during her dizzy spells. No chest pain, swelling, or skin changes.  She has a history of early menarche at age 22 and hormonal issues. She recalls seeing a cardiologist about seven or eight years ago, with an unremarkable workup. She has had thyroid issues in the past, which were treated with thyroid medication, and she has not had significant thyroid problems since.  She has IBS, which fluctuates between constipation and diarrhea, often triggered by stress.        ROS All review of systems negative except what is listed in the HPI      Objective:    Ht 5\' 11"  (1.803 m)   Wt 214 lb (97.1 kg)   BMI 29.85 kg/m    Orthostatic VS for the past 24 hrs:  BP- Lying Pulse- Lying BP- Sitting Pulse- Sitting BP- Standing at 0 minutes Pulse- Standing at 0 minutes  06/08/23 0947 101/46 75 150/82 72 136/75 92      Physical Exam Vitals reviewed.  Constitutional:      Appearance: Normal appearance.  Cardiovascular:     Rate and Rhythm: Normal rate and regular rhythm.     Heart sounds: Normal heart sounds.  Pulmonary:     Effort: Pulmonary effort is normal.     Breath sounds: Normal breath sounds.  Musculoskeletal:     Right lower leg: No edema.     Left lower leg: No edema.  Skin:    General: Skin is warm and dry.  Neurological:     Mental Status: She is alert and oriented to person, place, and time.  Psychiatric:        Mood and Affect: Mood normal.        Behavior: Behavior normal.        Thought Content: Thought content normal.        Judgment: Judgment normal.         Results for orders placed or performed in visit on 06/08/23  EKG 12-Lead  Result Value Ref Range   EKG 12 lead    EKG: SR 70 bpm, no ischemic changes      Assessment & Plan:    Problem List Items Addressed This Visit       Active Problems   Vitamin D deficiency   Relevant Orders   VITAMIN D 25 Hydroxy (Vit-D Deficiency, Fractures)   Palpitations   Relevant Orders   CBC with Differential/Platelet   Comprehensive metabolic panel with GFR   TSH   B12 and Folate Panel   EKG 12-Lead (Completed)   IBC + Ferritin   LONG TERM MONITOR (3-14 DAYS)   Ambulatory referral to Cardiology   Other Visit Diagnoses       Dizziness    -  Primary   Relevant Orders   CBC with Differential/Platelet   Comprehensive metabolic panel with GFR   TSH   B12 and Folate Panel   EKG 12-Lead (Completed)   IBC + Ferritin   LONG TERM MONITOR (3-14 DAYS)   Ambulatory referral to Cardiology     Fatigue, unspecified type       Relevant Orders   CBC with Differential/Platelet   Comprehensive metabolic panel with GFR   TSH   B12 and Folate Panel   EKG 12-Lead (Completed)   IBC + Ferritin   LONG TERM MONITOR (3-14 DAYS)   Ambulatory referral to Cardiology      Assessment & Plan Dizziness Lifelong dizziness with recent significant blood pressure fluctuations. Differential includes orthostatic hypotension, POTS, autonomic dysfunction, or cardiovascular issues. Orthostatic vital testing is atypical with initial increase in BP, stable heart rate, followed by decrease BP and increased HR.  - Labs today  - EKG stable - Order two-week heart monitor. - Refer to cardiology. - Patient aware of signs/symptoms requiring further/urgent evaluation.         No orders of the defined types were placed in this encounter.   Return for routine follow-up, PCP 4-6 months. Follow-up pending test results or as needed.  Clayborne Dana, NP

## 2023-06-16 DIAGNOSIS — F4312 Post-traumatic stress disorder, chronic: Secondary | ICD-10-CM | POA: Diagnosis not present

## 2023-06-21 DIAGNOSIS — F4312 Post-traumatic stress disorder, chronic: Secondary | ICD-10-CM | POA: Diagnosis not present

## 2023-06-30 DIAGNOSIS — F4312 Post-traumatic stress disorder, chronic: Secondary | ICD-10-CM | POA: Diagnosis not present

## 2023-07-05 DIAGNOSIS — R42 Dizziness and giddiness: Secondary | ICD-10-CM | POA: Diagnosis not present

## 2023-07-05 DIAGNOSIS — R002 Palpitations: Secondary | ICD-10-CM | POA: Diagnosis not present

## 2023-07-07 DIAGNOSIS — F4312 Post-traumatic stress disorder, chronic: Secondary | ICD-10-CM | POA: Diagnosis not present

## 2023-07-14 DIAGNOSIS — R42 Dizziness and giddiness: Secondary | ICD-10-CM | POA: Diagnosis not present

## 2023-07-14 DIAGNOSIS — R002 Palpitations: Secondary | ICD-10-CM | POA: Diagnosis not present

## 2023-07-14 DIAGNOSIS — F410 Panic disorder [episodic paroxysmal anxiety] without agoraphobia: Secondary | ICD-10-CM | POA: Diagnosis not present

## 2023-07-14 DIAGNOSIS — R5383 Other fatigue: Secondary | ICD-10-CM

## 2023-07-17 ENCOUNTER — Encounter: Payer: Self-pay | Admitting: Family Medicine

## 2023-07-20 ENCOUNTER — Ambulatory Visit

## 2023-07-20 VITALS — BP 120/80 | HR 67 | Ht 71.0 in | Wt 209.0 lb

## 2023-07-20 DIAGNOSIS — I471 Supraventricular tachycardia, unspecified: Secondary | ICD-10-CM

## 2023-07-20 HISTORY — DX: Supraventricular tachycardia, unspecified: I47.10

## 2023-07-20 MED ORDER — METOPROLOL TARTRATE 25 MG PO TABS: 12.5000 mg | ORAL_TABLET | Freq: Two times a day (BID) | ORAL | 3 refills | Status: DC

## 2023-07-20 NOTE — Patient Instructions (Signed)
 Medication Instructions:  Your physician has recommended you make the following change in your medication:   START: Metoprolol tartrate 12.5 mg two times daily  *If you need a refill on your cardiac medications before your next appointment, please call your pharmacy*  Lab Work: None If you have labs (blood work) drawn today and your tests are completely normal, you will receive your results only by: MyChart Message (if you have MyChart) OR A paper copy in the mail If you have any lab test that is abnormal or we need to change your treatment, we will call you to review the results.  Testing/Procedures: None  Follow-Up: At [facility], you and your health needs are our priority.  As part of our continuing mission to provide you with exceptional heart care, our providers are all part of one team.  This team includes your primary Cardiologist (physician) and Advanced Practice Providers or APPs (Physician Assistants and Nurse Practitioners) who all work together to provide you with the care you need, when you need it.  Your next appointment:   3 month(s)  Provider:   Bertha Broad, MD    We recommend signing up for the patient portal called "MyChart".  Sign up information is provided on this After Visit Summary.  MyChart is used to connect with patients for Virtual Visits (Telemedicine).  Patients are able to view lab/test results, encounter notes, upcoming appointments, etc.  Non-urgent messages can be sent to your provider as well.   To learn more about what you can do with MyChart, go to [email or web address].   Other Instructions None

## 2023-07-20 NOTE — Progress Notes (Signed)
 Cardiology Consultation:    Date:  07/20/2023   ID:  Hailey Cochran, DOB 08-12-1960, MRN [id number]  PCP:  Hailey Balk, MD  Cardiologist:  Hailey Evans Emanuele Mcwhirter, MD   Referring MD: Hailey Hock, NP   Chief Complaint  Patient presents with   Dizziness   Palpitations     ASSESSMENT AND PLAN:   Hailey Cochran 63 year old woman history of early menopause at age 64, hypothyroidism, mentions she has autistic spectrum/ADD, chronic history of palpitations ever since childhood and had multiple testing done at various times with echocardiogram, heart monitor.  Here for further evaluation of palpitations, heart monitor results showing paroxysmal SVT episodes correlating with her symptoms Problem List Items Addressed This Visit     Paroxysmal SVT (supraventricular tachycardia) (HCC) - Primary   Frequent runs of paroxysmal SVT on 14-day Zio patch monitor from June 12, 2023. Correlates with symptoms.  Reviewed potential various triggers for this including anxiety, stress, dehydration, temperature changes, weight changes, lack of sleep, stimulants such as excess caffeine energy drinks.  Mentions this has been bothering her lifelong but more recently she wants to focus on quality of life and wants to control the symptoms better.   Advised her to keep herself well-hydrated. Advised her to start low-dose beta-blocker, reviewed metoprolol tartrate 12.5 mg twice daily. Reviewed potential side effects including fatigue and weakness at high doses.  She is agreeable to try.  Will send out a prescription. Will follow-up tentatively in 3 months.       Relevant Medications   metoprolol tartrate (LOPRESSOR) 25 MG tablet   Return to clinic tentatively in 3 months.   History of Present Illness:    Hailey Cochran is a 63 y.o. female who is being seen today for the evaluation of palpitations, abnormal heart monitor results at the request of Hailey Hock, NP. Previously followed up with Hailey Cochran in September 2018 for palpitations.  Pleasant woman here for the visit by herself.  Had a history of early menopause at age 68, hypothyroidism, mentions she has autistic spectrum/ADD, chronic history of palpitations ever since childhood and had multiple testing done at various times with echocardiogram, heart monitor.  Mentions over the years she is focused on quality of life and hence despite the symptoms since she did not have any dangerous issues continue tolerating the episodes. Now mentions she wants to focus on quality of life.   Describes her episodes as occurring with change of position or at times with exertion and even at rest.  Noticeable more in hot weather especially when she goes down to the beach.  At times does feel lightheaded to the extent she feels like she is on a pass out.  Denies any syncopal episodes recently. At times feel shortness of breath as her heart is racing. No obvious trigger or relieving factors. Does tend to enjoy dancing and all but over the last many months she is cut back due to fear about the symptoms.  Denies any orthopnea, pedal edema.  Does not smoke. Does not drink alcohol. Does not use marijuana or other illicit substances. Drinks caffeine 2-3 drinks every other day as it helps her sleep and calm her nerves.  EKG PCPs office 06-08-2023 noted sinus rhythm with heart rate 70/min, PR interval normal 138 ms, QRS duration 94 ms.  No significant ST-T changes to suggest ischemia.  Blood work from 06-08-2023 TSH normal 3.92 CMP unremarkable except for ALT mildly elevated 46. CBC unremarkable.  Next  Last lipid panel is from 11-09-2016  Heart monitor for 14 days study from 06-12-2023 reviewed.  Shows predominantly sinus rhythm average heart rate 73/min [ranging from 49 to 127 bpm].  Rare supraventricular ectopy burden and ventricular ectopy burden less than 1%.  14 short runs of supraventricular  tachycardia noted with the longest episode lasting 16 beats.  Occasional instances of junctional rhythm were reported but on observation appears possibly ectopic atrial tachycardia  Versus SVT.  No pauses, high-grade AV block.  Multiple triggered events and diary entries [total 106], multiple tracings appear to correlate with runs of SVT causing symptoms.  Echocardiogram from May 2018 with normal biventricular function LVEF 60 to 65%, mild MR. Past Medical History:  Diagnosis Date   Anemia    Cervical cancer screening 02/02/2015   Menarche at 6, stop at 7.5, restarted at 10 Regular and heavy flow and painful from endocmetriosis No history of abnormal pap in past G5P2, s/p 3 miscarriages, 2 svd No history of abnormal MGM, never had a MGM, no concerns, nursed 8 kids, declines MGM for now No concerns today  gyn surgeries: right oophorectomy for an endometrioma LMP at age 38   Dry eyes 07/27/2016   Elevated antinuclear antibody (ANA) level    Elevated liver enzymes    Endometriosis    Fibromyalgia    Glaucoma    History of endometriosis    Hyperglycemia 07/26/2016   Lumbar herniated disc    Overweight 09/29/2014   Overweight 09/29/2014   Palpitations 07/12/2016   Premature ovarian failure    30s   Preventative health care 02/07/2015   Thyroid  disease    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 [facility] Thyroid    Vitamin B 12 deficiency     Past Surgical History:  Procedure Laterality Date   SALPINGOOPHORECTOMY Right 1990   endometriosis    Current Medications: Current Meds  Medication Sig   5-Hydroxytryptophan (5-HTP) 100 MG CAPS    Acetylcysteine (NAC) 600 MG CAPS    albuterol  (VENTOLIN  HFA) 108 (90 Base) MCG/ACT inhaler Inhale 2 puffs into the lungs every 6 (six) hours as needed for wheezing or shortness of breath.   busPIRone  (BUSPAR ) 5 MG tablet Take 1 tablet (5 mg total) by mouth 3 (three) times daily.   fexofenadine (ALLEGRA) 180 MG tablet Take 180 mg by mouth daily.   Gamma-Aminobutyric Acid (GABA  PO)    L-Theanine 200 MG CAPS    L-Tyrosine 500 MG CAPS    Magnesium Bisglycinate (MAG GLYCINATE) 100 MG TABS    metoprolol tartrate (LOPRESSOR) 25 MG tablet Take 0.5 tablets (12.5 mg total) by mouth 2 (two) times daily.   [START ON 07/24/2023] moxifloxacin (VIGAMOX) 0.5 % ophthalmic solution Place 1 drop into the left eye 4 (four) times daily.   [START ON 07/24/2023] prednisoLONE acetate (PRED FORTE) 1 % ophthalmic suspension Place 1 drop into the right eye 4 (four) times daily.   Respiratory Therapy Supplies (ONE FLOW SPIROMETER) KIT 1 kit by Does not apply route in the morning and at bedtime.   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  Other Topics Concern   Not on file  Social History Narrative   2 biological children 48- daughter [name]                                    72- [name]   3 miscarraiges       Adopted children:   54 year old- [name]   27 year old amelia   63 year old evan michael   7 yosiah   6 jeshua   6 kaleb daniel      Married- Samuel   Homeschools her children   Enjoys Psychologist, forensic, Air traffic controller, dancing   Christian, jewish heritage   Social Drivers of Health   Financial Resource Strain: Patient Declined (06/07/2023)   Overall Financial Resource Strain (CARDIA)    Difficulty of Paying Living Expenses: Patient declined   Food Insecurity: Food Insecurity Present (06/07/2023)   Hunger Vital Sign    Worried About Running Out of Food in the Last Year: Often true    Ran Out of Food in the Last Year: Never true  Transportation Needs: Unmet Transportation Needs (06/07/2023)   PRAPARE - Administrator, Civil Service (Medical): Yes    Lack of Transportation (Non-Medical): Yes  Physical Activity: Unknown (06/07/2023)   Exercise Vital Sign    Days of Exercise per Week: 0 days    Minutes of Exercise per Session: Not on file  Stress: Stress Concern Present (06/07/2023)   [employer] of Occupational Health - Occupational Stress Questionnaire    Feeling of Stress : Very much  Social Connections: Unknown (06/07/2023)   Social Connection and Isolation Panel [NHANES]    Frequency of Communication with Friends and Family: Never    Frequency of Social Gatherings with Friends and Family: Patient declined    Attends Religious Services: Never    Database administrator or Organizations: No    Attends Engineer, structural: Not on file    Marital Status: Divorced     Family History: The patient's family history includes Alcohol abuse in her father; Alzheimer's disease in her paternal grandfather; Anemia in her maternal aunt; Bullous pemphigoid in her father; Cancer in her father; Dementia in her maternal grandmother and mother; Diabetes in her maternal grandmother and mother; Eczema in her son; Gallstones in her maternal aunt and maternal grandfather; Heart disease in her maternal grandfather; Hepatitis C in her father; Hypertension in her father, mother, and paternal grandfather; Leukemia in her mother; Muscular dystrophy in her paternal uncle; Other in her sister; Parkinson's disease in her mother; Rickets in her son; Single kidney in her daughter. ROS:   Please see the history of present illness.    All 14 point review of systems negative except as described per history of present illness.  EKGs/Labs/Other  Studies Reviewed:    The following studies were reviewed today:   EKG:       Recent Labs: 06/08/2023: ALT 46; BUN 19; Creatinine, Ser 0.79; Hemoglobin 13.7; Platelets 229.0; Potassium 4.3; Sodium 139; TSH 3.92  Recent Lipid Panel    Component Value Date/Time  CHOL 193 11/09/2016 0948   TRIG 103 11/09/2016 0948   HDL 56 11/09/2016 0948   CHOLHDL 3.4 11/09/2016 0948   VLDL 30.2 02/02/2015 1159   LDLCALC 116 (H) 11/09/2016 0948    Physical Exam:    VS:  BP 120/80   Pulse 67   Ht 5\' 11"  (1.803 m)   Wt 209 lb (94.8 kg)   SpO2 99%   BMI 29.15 kg/m     Wt Readings from Last 3 Encounters:  07/20/23 209 lb (94.8 kg)  06/08/23 214 lb (97.1 kg)  06/15/21 203 lb 6.4 oz (92.3 kg)     GENERAL:  Well nourished, well developed in no acute distress NECK: No JVD; No carotid bruits CARDIAC: RRR, S1 and S2 present, no murmurs, no rubs, no gallops CHEST:  Clear to auscultation without rales, wheezing or rhonchi  Extremities: No pitting pedal edema. Pulses bilaterally symmetric with radial 2+ and dorsalis pedis 2+ NEUROLOGIC:  Alert and oriented x 3  Medication Adjustments/Labs and Tests Ordered: Current medicines are reviewed at length with the patient today.  Concerns regarding medicines are outlined above.  No orders of the defined types were placed in this encounter.  Meds ordered this encounter  Medications   metoprolol tartrate (LOPRESSOR) 25 MG tablet    Sig: Take 0.5 tablets (12.5 mg total) by mouth 2 (two) times daily.    Dispense:  90 tablet    Refill:  3    Signed, Arieh Bogue reddy Bryanda Mikel, MD, MPH, [facility]. 07/20/2023 11:13 AM    [facility]

## 2023-07-20 NOTE — Assessment & Plan Note (Signed)
 Frequent runs of paroxysmal SVT on 14-day Zio patch monitor from June 12, 2023. Correlates with symptoms.  Reviewed potential various triggers for this including anxiety, stress, dehydration, temperature changes, weight changes, lack of sleep, stimulants such as excess caffeine energy drinks.  Mentions this has been bothering her lifelong but more recently she wants to focus on quality of life and wants to control the symptoms better.   Advised her to keep herself well-hydrated. Advised her to start low-dose beta-blocker, reviewed metoprolol tartrate 12.5 mg twice daily. Reviewed potential side effects including fatigue and weakness at high doses.  She is agreeable to try.  Will send out a prescription. Will follow-up tentatively in 3 months.

## 2023-07-21 DIAGNOSIS — F4312 Post-traumatic stress disorder, chronic: Secondary | ICD-10-CM | POA: Diagnosis not present

## 2023-07-28 DIAGNOSIS — F4312 Post-traumatic stress disorder, chronic: Secondary | ICD-10-CM | POA: Diagnosis not present

## 2023-08-04 DIAGNOSIS — F4312 Post-traumatic stress disorder, chronic: Secondary | ICD-10-CM | POA: Diagnosis not present

## 2023-08-11 DIAGNOSIS — F4312 Post-traumatic stress disorder, chronic: Secondary | ICD-10-CM | POA: Diagnosis not present

## 2023-08-17 DIAGNOSIS — H2511 Age-related nuclear cataract, right eye: Secondary | ICD-10-CM | POA: Diagnosis not present

## 2023-08-18 DIAGNOSIS — F4312 Post-traumatic stress disorder, chronic: Secondary | ICD-10-CM | POA: Diagnosis not present

## 2023-08-25 DIAGNOSIS — F332 Major depressive disorder, recurrent severe without psychotic features: Secondary | ICD-10-CM | POA: Diagnosis not present

## 2023-09-01 DIAGNOSIS — F4312 Post-traumatic stress disorder, chronic: Secondary | ICD-10-CM | POA: Diagnosis not present

## 2023-09-05 DIAGNOSIS — F4312 Post-traumatic stress disorder, chronic: Secondary | ICD-10-CM | POA: Diagnosis not present

## 2023-09-12 DIAGNOSIS — F4312 Post-traumatic stress disorder, chronic: Secondary | ICD-10-CM | POA: Diagnosis not present

## 2023-09-20 DIAGNOSIS — F4312 Post-traumatic stress disorder, chronic: Secondary | ICD-10-CM | POA: Diagnosis not present

## 2023-09-21 DIAGNOSIS — H2512 Age-related nuclear cataract, left eye: Secondary | ICD-10-CM | POA: Diagnosis not present

## 2023-09-21 DIAGNOSIS — H25812 Combined forms of age-related cataract, left eye: Secondary | ICD-10-CM | POA: Diagnosis not present

## 2023-09-28 DIAGNOSIS — F4312 Post-traumatic stress disorder, chronic: Secondary | ICD-10-CM | POA: Diagnosis not present

## 2023-10-06 DIAGNOSIS — F4312 Post-traumatic stress disorder, chronic: Secondary | ICD-10-CM | POA: Diagnosis not present

## 2023-10-13 DIAGNOSIS — F4312 Post-traumatic stress disorder, chronic: Secondary | ICD-10-CM | POA: Diagnosis not present

## 2023-10-20 DIAGNOSIS — H5213 Myopia, bilateral: Secondary | ICD-10-CM | POA: Diagnosis not present

## 2023-10-20 DIAGNOSIS — F4312 Post-traumatic stress disorder, chronic: Secondary | ICD-10-CM | POA: Diagnosis not present

## 2023-10-23 DIAGNOSIS — N809 Endometriosis, unspecified: Secondary | ICD-10-CM | POA: Insufficient documentation

## 2023-10-23 DIAGNOSIS — M5126 Other intervertebral disc displacement, lumbar region: Secondary | ICD-10-CM | POA: Insufficient documentation

## 2023-10-23 DIAGNOSIS — R748 Abnormal levels of other serum enzymes: Secondary | ICD-10-CM | POA: Insufficient documentation

## 2023-10-23 DIAGNOSIS — Z8742 Personal history of other diseases of the female genital tract: Secondary | ICD-10-CM | POA: Insufficient documentation

## 2023-10-23 DIAGNOSIS — R768 Other specified abnormal immunological findings in serum: Secondary | ICD-10-CM | POA: Insufficient documentation

## 2023-10-24 ENCOUNTER — Other Ambulatory Visit: Payer: Self-pay

## 2023-10-24 ENCOUNTER — Encounter: Payer: Self-pay | Admitting: Family Medicine

## 2023-10-24 ENCOUNTER — Ambulatory Visit (INDEPENDENT_AMBULATORY_CARE_PROVIDER_SITE_OTHER): Admitting: Family Medicine

## 2023-10-24 ENCOUNTER — Ambulatory Visit

## 2023-10-24 VITALS — BP 130/85 | HR 76 | Ht 71.0 in | Wt 210.0 lb

## 2023-10-24 VITALS — BP 128/88 | HR 72 | Ht 71.0 in | Wt 210.0 lb

## 2023-10-24 DIAGNOSIS — J309 Allergic rhinitis, unspecified: Secondary | ICD-10-CM

## 2023-10-24 DIAGNOSIS — H9193 Unspecified hearing loss, bilateral: Secondary | ICD-10-CM

## 2023-10-24 DIAGNOSIS — Z0000 Encounter for general adult medical examination without abnormal findings: Secondary | ICD-10-CM

## 2023-10-24 DIAGNOSIS — I340 Nonrheumatic mitral (valve) insufficiency: Secondary | ICD-10-CM | POA: Diagnosis not present

## 2023-10-24 DIAGNOSIS — M797 Fibromyalgia: Secondary | ICD-10-CM | POA: Diagnosis not present

## 2023-10-24 DIAGNOSIS — I471 Supraventricular tachycardia, unspecified: Secondary | ICD-10-CM | POA: Diagnosis not present

## 2023-10-24 DIAGNOSIS — R102 Pelvic and perineal pain: Secondary | ICD-10-CM

## 2023-10-24 MED ORDER — TIZANIDINE HCL 4 MG PO TABS
4.0000 mg | ORAL_TABLET | Freq: Three times a day (TID) | ORAL | 2 refills | Status: DC | PRN
Start: 2023-10-24 — End: 2023-12-14

## 2023-10-24 MED ORDER — METOPROLOL TARTRATE 25 MG PO TABS: 12.5000 mg | ORAL_TABLET | Freq: Every day | ORAL | 3 refills | Status: AC | PRN

## 2023-10-24 MED ORDER — FEXOFENADINE HCL 180 MG PO TABS: 180.0000 mg | ORAL_TABLET | Freq: Every day | ORAL | 1 refills | Status: DC

## 2023-10-24 NOTE — Patient Instructions (Addendum)

## 2023-10-24 NOTE — Progress Notes (Signed)
 Cardiology Consultation:    Date:  10/24/2023   ID:  Hailey Cochran, DOB [birth year], MRN [id number]  PCP:  Hailey Harlene LABOR, MD  Cardiologist:  Hailey SAUNDERS Ebonie Westerlund, MD   Referring MD: Hailey Harlene LABOR, MD   No chief complaint on file.    ASSESSMENT AND PLAN:   Hailey Cochran 63 year old woman  history of hypothyroidism, early menopause [at age 29], reports history of autism spectrum/ADD, chronic palpitations ever since childhood and had workup at various times with heart monitors and echocardiogram and was seen at last visit initially given ongoing symptoms of palpitations associated with lightheadedness.  Also has history of fibromyalgia and long COVID symptoms.  Heart monitor 14-day study 06/12/2023 reviewed noted predominantly sinus rhythm with rare ventricular and supraventricular ectopy burden less than 1% [average heart rate 73/min ranging from 49 to 127 bpm], with instances of SVT with the longest episode lasting 16 beats.  Multiple patient triggered events [total 106] with some tracings correlating with runs of SVT.  Echocardiogram May 2018 noted normal biventricular function and mild MR.  Here for follow-up visit.  Problem List Items Addressed This Visit     Paroxysmal SVT (supraventricular tachycardia) (HCC) - Primary   Chronic history of palpitations. Short runs of SVT noted on Zio patch April 2025 associated with symptoms.  Various triggers as reviewed under HPI at last office visit and today.  Advised to keep herself well-hydrated. Advised about no significant major abnormalities of sustained arrhythmias and no significant long-term effects from these short runs of SVT. Reassured her about the findings thus far.  Advised regular exercise.  She is hesitant to take medications on a regular basis.  Has not taken metoprolol  that was prescribed at last office visit.  Will renew prescription for metoprolol  tartrate 12.5 mg to be used on an  as-needed basis for the days where she is having more frequent symptoms.       Relevant Medications   metoprolol  tartrate (LOPRESSOR ) 25 MG tablet   Other Relevant Orders   ECHOCARDIOGRAM COMPLETE   Mild mitral regurgitation by prior echocardiogram   Reported on prior echocardiogram May 2018.  Follow-up with repeat echocardiogram for interval change.      Relevant Medications   metoprolol  tartrate (LOPRESSOR ) 25 MG tablet   Other Relevant Orders   ECHOCARDIOGRAM COMPLETE   Return to clinic tentatively in 6 months.   History of Present Illness:    Hailey Cochran is a 63 y.o. female who is being seen today for follow-up visit. PCP is Hailey Harlene LABOR, MD. Last visit with me in the office was 07/20/2023.  Patient with history of hypothyroidism, early menopause at age 16, reports history of autism spectrum/ADD, chronic palpitations ever since childhood and had workup at various times with heart monitors and echocardiogram and was seen at last visit initially given ongoing symptoms of palpitations associated with lightheadedness.  Also has history of fibromyalgia and long COVID Heart monitor 14-day study 06/12/2023 reviewed noted predominantly sinus rhythm with rare ventricular and supraventricular ectopy burden less than 1% [average heart rate 73/min ranging from 49 to 127 bpm], with instances of SVT with the longest episode lasting 16 beats.  Multiple patient triggered events [total 106] with some tracings correlating with runs of SVT. Echocardiogram May 2018 noted normal biventricular function and mild MR.  In the context of paroxysmal SVT noted on Zio patch and correlating with symptoms recommended starting metoprolol  tartrate 12.5 mg twice daily.  She has never started  taking metoprolol  due to her ongoing concerns with eye surgery. She is relatively similar with her symptoms reporting as palpitations that occur intermittently, noticeably worse with change in  position. Worse with exposure to heat.  She has cut down on caffeine consumption and has not noticed any significant change and continues to consume mild amount of caffeine which helps with her mood.  Does not smoke, does not drink alcohol, does not do any other illicit drugs.   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/08/2019   Dry eyes 07/27/2016   Elevated antinuclear antibody (ANA) level    Elevated liver enzymes    Elevated liver function tests 04/22/2014   Endometriosis    Family history of hepatitis C 04/03/2016   Fibromyalgia    History of endometriosis    Hyperglycemia 07/26/2016   Hyperlipidemia 07/08/2019   Low back pain with left-sided sciatica 04/09/2014   Lumbar herniated disc    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   Onychomycosis 04/09/2014   Other constipation 04/03/2016   Overweight 09/29/2014   Overweight 09/29/2014   Palpitations 07/12/2016   Paroxysmal SVT (supraventricular tachycardia) (HCC) 07/20/2023   Peripheral neuropathy 09/14/2021   Premature ovarian failure    30s   Preventative health care 02/07/2015   Pulmonary nodule 07/08/2019   SOB (shortness of breath) 09/16/2019   Thyroid  disease    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 [facility] Thyroid    Vitamin B 12 deficiency    Vitamin D  deficiency 04/22/2014    Past Surgical History:  Procedure Laterality Date   SALPINGOOPHORECTOMY Right 1990   endometriosis     Current Medications: Current Meds  Medication Sig   5-Hydroxytryptophan (5-HTP) 100 MG CAPS    Acetylcysteine (NAC) 600 MG CAPS    albuterol  (VENTOLIN  HFA) 108 (90 Base) MCG/ACT inhaler Inhale 2 puffs into the lungs every 6 (six) hours as needed for wheezing or shortness of breath.   busPIRone  (BUSPAR ) 5 MG tablet Take 1 tablet (5 mg total) by mouth 3 (three) times daily.   fexofenadine  (ALLEGRA ) 180 MG tablet Take 1 tablet (180 mg total) by mouth daily.   Gamma-Aminobutyric Acid (GABA PO)    L-Theanine 200 MG CAPS    L-Tyrosine 500 MG CAPS    Magnesium Bisglycinate (MAG GLYCINATE) 100 MG TABS    metoprolol  tartrate (LOPRESSOR ) 25 MG tablet Take 0.5 tablets (12.5 mg total) by mouth daily as needed (palpitations).   Respiratory Therapy Supplies (ONE FLOW SPIROMETER) KIT 1 kit by Does not apply route in the morning and at bedtime.   tiZANidine  (ZANAFLEX ) 4 MG tablet Take 1 tablet (4 mg total) by mouth every 8 (eight) hours as needed for muscle spasms.   UNABLE TO FIND Med Name: Silexin     Allergies:   Morphine, Other, and Wasp venom   Social History   Socioeconomic History   Marital status: Divorced    Spouse name: Not on file  Number of children: 8   Years of education: Not on file   Highest education level: Bachelor's degree (e.g., BA, AB, BS)  Occupational History   Occupation: Camera operator  Tobacco Use   Smoking status: Never   Smokeless tobacco: Never  Substance and Sexual Activity   Alcohol use: No    Alcohol/week: 0.0 standard drinks of alcohol   Drug use: No   Sexual activity: Yes    Comment: lives with children, husband, sister in law and GM, no dietary restrictions avoids pork and alcohol.  Other Topics Concern   Not on file  Social History Narrative   2 biological children 65- daughter [name]                                    23- [name]   3 miscarraiges       Adopted children:   71 year old- [name]   34 year old amelia   63 year old evan  michael   7 yosiah   6 jeshua   6 kaleb daniel      Married- Samuel   Homeschools her children   Enjoys Psychologist, forensic, Air traffic controller, dancing   Christian, jewish heritage   Social Drivers of Corporate investment banker Strain: Low Risk  (10/23/2023)   Overall Financial Resource Strain (CARDIA)    Difficulty of Paying Living Expenses: Not hard at all  Food Insecurity: Food Insecurity Present (10/23/2023)   Hunger Vital Sign    Worried About Running Out of Food in the Last Year: Sometimes true    Ran Out of Food in the Last Year: Sometimes true  Transportation Needs: Unmet Transportation Needs (10/23/2023)   PRAPARE - Administrator, Civil Service (Medical): Yes    Lack of Transportation (Non-Medical): Yes  Physical Activity: Inactive (10/23/2023)   Exercise Vital Sign    Days of Exercise per Week: 0 days    Minutes of Exercise per Session: Not on file  Stress: Stress Concern Present (10/23/2023)   [employer] of Occupational Health - Occupational Stress Questionnaire    Feeling of Stress: Very much  Social Connections: Socially Isolated (10/23/2023)   Social Connection and Isolation Panel    Frequency of Communication with Friends and Family: Never    Frequency of Social Gatherings with Friends and Family: Never    Attends Religious Services: Never    Database administrator or Organizations: No    Attends Engineer, structural: Not on file    Marital Status: Divorced     Family History: The patient's family history includes Alcohol abuse in her father; Alzheimer's disease in her paternal grandfather; Anemia in her maternal aunt; Bullous pemphigoid in her father; Cancer in her father; Dementia in her maternal grandmother and mother; Diabetes in her maternal grandmother and mother; Eczema in her son; Gallstones in her maternal aunt and maternal grandfather; Heart disease in her maternal grandfather; Hepatitis C in her father; Hypertension in her father,  mother, and paternal grandfather; Leukemia in her mother; Muscular dystrophy in her paternal uncle; Other in her sister; Parkinson's disease in her mother; Rickets in her son; Single kidney in her daughter. ROS:   Please see the history of present illness.    All 14 point review of systems negative except as described per history of present illness.  EKGs/Labs/Other Studies Reviewed:    The following studies were reviewed today:  EKG:       Recent Labs: 06/08/2023: ALT 46; BUN 19; Creatinine, Ser 0.79; Hemoglobin 13.7; Platelets 229.0; Potassium 4.3; Sodium 139; TSH 3.92  Recent Lipid Panel    Component Value Date/Time   CHOL 193 11/09/2016 0948   TRIG 103 11/09/2016 0948   HDL 56 11/09/2016 0948   CHOLHDL 3.4 11/09/2016 0948   VLDL 30.2 02/02/2015 1159   LDLCALC 116 (H) 11/09/2016 0948    Physical Exam:    VS:  BP 128/88   Pulse 72   Ht 5' 11 (1.803 m)   Wt 210 lb (95.3 kg)   SpO2 98%   BMI 29.29 kg/m     Wt Readings from Last 3 Encounters:  10/24/23 210 lb (95.3 kg)  10/24/23 210 lb (95.3 kg)  07/20/23 209 lb (94.8 kg)     GENERAL:  Well nourished, well developed in no acute distress CARDIAC: RRR, S1 and S2 present, no murmurs, no rubs, no gallops Extremities: No pitting pedal edema. Pulses bilaterally symmetric with radial 2+ and dorsalis pedis 2+ NEUROLOGIC:  Alert and oriented x 3  Medication Adjustments/Labs and Tests Ordered: Current medicines are reviewed at length with the patient today.  Concerns regarding medicines are outlined above.  Orders Placed This Encounter  Procedures   ECHOCARDIOGRAM COMPLETE   Meds ordered this encounter  Medications   metoprolol  tartrate (LOPRESSOR ) 25 MG tablet    Sig: Take 0.5 tablets (12.5 mg total) by mouth daily as needed (palpitations).    Dispense:  30 tablet    Refill:  3    Signed, Cristie Mckinney reddy Ramaj Frangos, MD, MPH, [facility]. 10/24/2023 11:41 AM    [facility]

## 2023-10-24 NOTE — Progress Notes (Signed)
 Acute Office Visit  Subjective:     Patient ID: Hailey Cochran, female    DOB: 28-Dec-1960, 63 y.o.   MRN: [id number]  Chief Complaint  Patient presents with   Medical Management of Chronic Issues    HPI Patient is in today for fibromyalgia.   Discussed the use of AI scribe software for clinical note transcription with the patient, who gave verbal consent to proceed.  History of Present Illness Hailey Cochran is a 64 year old female with fibromyalgia and long COVID who presents with severe pain and burning sensations.  She experiences severe pain and burning sensations, particularly after activities such as walking or washing her hair. This burning sensation has persisted for four years and worsens with heat exposure. Her feet feel like they are on fire by the end of the day, and her head feels like it is burning after washing her hair.  She has a history of fibromyalgia diagnosed in her mid-thirties, with chronic fatigue since 1992. She experiences muscle pain associated with fibromyalgia and additional pain in her bones and joints, including her fingers and hands, since contracting COVID-19. She is currently experiencing a severe flare-up, which she describes as a 'really bad time.'  She has tried various treatments for her pain, including pain medications and antidepressants, but has found them ineffective or unsuitable due to various issues and concerns about her mental health. Recently, she tried Flexeril  from a friend, which provided some muscle relief, but she is cautious about medication use due to past addiction issues with benzodiazepines.  She reports sensitivity and thinning of the skin in her vaginal area, which she attributes to hormonal changes post-menopause. She has been postmenopausal since age 55 due to pre-ovarian failure and has not used hormone replacement therapy except for progesterone in the past. No dryness, itching,  or discharge in the vaginal area.  She experiences episodes of dizziness and near-fainting when standing up quickly. She has a history of SVTs and requires a high salt intake to manage her symptoms. She sees her cardiologist this afternoon   She reports significant hair loss since contracting COVID-19, which has not regrown. She has no family history of hair loss and her thyroid  function was normal when last checked.        ROS All review of systems negative except what is listed in the HPI      Objective:    BP 130/85   Pulse 76   Ht 5' 11 (1.803 m)   Wt 210 lb (95.3 kg)   SpO2 98%   BMI 29.29 kg/m    Physical Exam Vitals reviewed.  Constitutional:      Appearance: Normal appearance.  Cardiovascular:     Rate and Rhythm: Normal rate and regular rhythm.  Pulmonary:     Effort: Pulmonary effort is normal.     Breath sounds: Normal breath sounds.  Musculoskeletal:     Right lower leg: No edema.     Left lower leg: No edema.  Skin:    General: Skin is warm and dry.  Neurological:     Mental Status: She is alert and oriented to person, place, and time.  Psychiatric:        Mood and Affect: Mood normal.        Behavior: Behavior normal.        Thought Content: Thought content normal.        Judgment: Judgment normal.     No results  found for any visits on 10/24/23.      Assessment & Plan:   Problem List Items Addressed This Visit       Active Problems   Fibromyalgia - Primary   Relevant Medications   tiZANidine  (ZANAFLEX ) 4 MG tablet   Other Visit Diagnoses       Allergic rhinitis, unspecified seasonality, unspecified trigger       Relevant Medications   fexofenadine  (ALLEGRA ) 180 MG tablet     Decreased hearing of both ears       Relevant Orders   Ambulatory referral to Audiology     Vulvovaginal discomfort          Assessment & Plan Fibromyalgia with chronic pain and chronic fatigue syndrome Severe flare-up with burning pain, muscle  tightness, and joint pain. Previous treatments ineffective. Concerns about liver enzyme elevation and substance sensitivity. Flexeril  provided temporary relief but concern due to SVT history/symptoms. Zanaflex  proposed as alternative. - Prescribe Zanaflex  as a muscle relaxer alternative to Flexeril  due to lower risk with arrhythmias. - Monitor for drowsiness with Zanaflex  use. - Not interested in further workup or other meds at this time.   Palpitations and paroxysmal supraventricular tachycardia with orthostatic intolerance and dizziness -Following with cardiology   Allergic rhinitis - Refill prescription for Allegra .  Vulvovaginal atrophy/sensitivity Increased sensitivity and thinning of vulvar skin.  - Declined exam or referral today - Schedule a physical and update pap - Monitor for any changes in skin appearance or symptoms.  Hearing loss (evaluation planned) Request for hearing evaluation due to concerns about hearing loss. - Schedule hearing test.    Meds ordered this encounter  Medications   tiZANidine  (ZANAFLEX ) 4 MG tablet    Sig: Take 1 tablet (4 mg total) by mouth every 8 (eight) hours as needed for muscle spasms.    Dispense:  30 tablet    Refill:  2    Supervising Provider:   DOMENICA BLACKBIRD A [[id number]]   fexofenadine  (ALLEGRA ) 180 MG tablet    Sig: Take 1 tablet (180 mg total) by mouth daily.    Dispense:  90 tablet    Refill:  1    Supervising Provider:   DOMENICA BLACKBIRD A [[id number]]    Return in about 3 months (around 01/24/2024) for physical ([facility]).  Hailey KATHEE Mon, NP

## 2023-10-24 NOTE — Assessment & Plan Note (Signed)
 Reported on prior echocardiogram May 2018.  Follow-up with repeat echocardiogram for interval change.

## 2023-10-24 NOTE — Assessment & Plan Note (Signed)
 Chronic history of palpitations. Short runs of SVT noted on Zio patch April 2025 associated with symptoms.  Various triggers as reviewed under HPI at last office visit and today.  Advised to keep herself well-hydrated. Advised about no significant major abnormalities of sustained arrhythmias and no significant long-term effects from these short runs of SVT. Reassured her about the findings thus far.  Advised regular exercise.  She is hesitant to take medications on a regular basis.  Has not taken metoprolol  that was prescribed at last office visit.  Will renew prescription for metoprolol  tartrate 12.5 mg to be used on an as-needed basis for the days where she is having more frequent symptoms.

## 2023-10-27 DIAGNOSIS — F4312 Post-traumatic stress disorder, chronic: Secondary | ICD-10-CM | POA: Diagnosis not present

## 2023-10-31 ENCOUNTER — Other Ambulatory Visit: Payer: Self-pay | Admitting: Family Medicine

## 2023-10-31 ENCOUNTER — Encounter: Payer: Self-pay | Admitting: Family Medicine

## 2023-10-31 MED ORDER — LEVOCETIRIZINE DIHYDROCHLORIDE 5 MG PO TABS: 5.0000 mg | ORAL_TABLET | Freq: Every evening | ORAL | 1 refills | Status: DC

## 2023-11-01 MED ORDER — LEVOCETIRIZINE DIHYDROCHLORIDE 5 MG PO TABS
5.0000 mg | ORAL_TABLET | Freq: Every evening | ORAL | 1 refills | Status: DC
Start: 2023-11-01 — End: 2023-12-14

## 2023-11-03 DIAGNOSIS — F4312 Post-traumatic stress disorder, chronic: Secondary | ICD-10-CM | POA: Diagnosis not present

## 2023-11-10 ENCOUNTER — Encounter: Admitting: Family Medicine

## 2023-11-10 DIAGNOSIS — F4312 Post-traumatic stress disorder, chronic: Secondary | ICD-10-CM | POA: Diagnosis not present

## 2023-11-13 ENCOUNTER — Ambulatory Visit: Admitting: Family Medicine

## 2023-11-14 ENCOUNTER — Ambulatory Visit (HOSPITAL_BASED_OUTPATIENT_CLINIC_OR_DEPARTMENT_OTHER)

## 2023-11-17 DIAGNOSIS — F4312 Post-traumatic stress disorder, chronic: Secondary | ICD-10-CM | POA: Diagnosis not present

## 2023-11-21 ENCOUNTER — Ambulatory Visit: Admitting: Audiologist

## 2023-11-21 DIAGNOSIS — F4312 Post-traumatic stress disorder, chronic: Secondary | ICD-10-CM | POA: Diagnosis not present

## 2023-11-28 DIAGNOSIS — F4312 Post-traumatic stress disorder, chronic: Secondary | ICD-10-CM | POA: Diagnosis not present

## 2023-12-05 DIAGNOSIS — F4312 Post-traumatic stress disorder, chronic: Secondary | ICD-10-CM | POA: Diagnosis not present

## 2023-12-10 NOTE — Assessment & Plan Note (Signed)
 Supplement and monitor

## 2023-12-10 NOTE — Assessment & Plan Note (Signed)
 hgba1c acceptable, minimize simple carbs. Increase exercise as tolerated.

## 2023-12-10 NOTE — Assessment & Plan Note (Signed)
 Encourage heart healthy diet such as MIND or DASH diet, increase exercise, avoid trans fats, simple carbohydrates and processed foods, consider a krill or fish or flaxseed oil cap daily.

## 2023-12-10 NOTE — Assessment & Plan Note (Signed)
 Continue to monitor

## 2023-12-11 ENCOUNTER — Ambulatory Visit (HOSPITAL_BASED_OUTPATIENT_CLINIC_OR_DEPARTMENT_OTHER)
Admission: RE | Admit: 2023-12-11 | Discharge: 2023-12-11 | Disposition: A | Discharge status: Home / Self Care | Source: Ambulatory Visit

## 2023-12-11 DIAGNOSIS — I340 Nonrheumatic mitral (valve) insufficiency: Secondary | ICD-10-CM | POA: Insufficient documentation

## 2023-12-11 DIAGNOSIS — I471 Supraventricular tachycardia, unspecified: Secondary | ICD-10-CM | POA: Diagnosis not present

## 2023-12-11 LAB — ECHOCARDIOGRAM COMPLETE
AR max vel: 2.1 cm2
AV Area VTI: 2.01 cm2
AV Area mean vel: 1.83 cm2
AV Mean grad: 4 mmHg
AV Peak grad: 7.7 mmHg
Ao pk vel: 1.39 m/s
Area-P 1/2: 3.54 cm2
Calc EF: 69.3 %
MV M vel: 5.56 m/s
MV Peak grad: 123.7 mmHg
S' Lateral: 2.3 cm
Single Plane A2C EF: 68.7 %
Single Plane A4C EF: 71 %

## 2023-12-14 ENCOUNTER — Encounter: Payer: Self-pay | Admitting: Family Medicine

## 2023-12-14 ENCOUNTER — Ambulatory Visit (INDEPENDENT_AMBULATORY_CARE_PROVIDER_SITE_OTHER): Admitting: Family Medicine

## 2023-12-14 VITALS — BP 134/82 | HR 67 | Ht 71.0 in | Wt 207.4 lb

## 2023-12-14 DIAGNOSIS — M791 Myalgia, unspecified site: Secondary | ICD-10-CM

## 2023-12-14 DIAGNOSIS — E785 Hyperlipidemia, unspecified: Secondary | ICD-10-CM

## 2023-12-14 DIAGNOSIS — R739 Hyperglycemia, unspecified: Secondary | ICD-10-CM

## 2023-12-14 DIAGNOSIS — Z23 Encounter for immunization: Secondary | ICD-10-CM

## 2023-12-14 DIAGNOSIS — M2550 Pain in unspecified joint: Secondary | ICD-10-CM

## 2023-12-14 DIAGNOSIS — E538 Deficiency of other specified B group vitamins: Secondary | ICD-10-CM

## 2023-12-14 DIAGNOSIS — E559 Vitamin D deficiency, unspecified: Secondary | ICD-10-CM

## 2023-12-14 DIAGNOSIS — E079 Disorder of thyroid, unspecified: Secondary | ICD-10-CM | POA: Diagnosis not present

## 2023-12-14 MED ORDER — MELOXICAM 15 MG PO TABS: 15.0000 mg | ORAL_TABLET | Freq: Every day | ORAL | 3 refills | Status: AC

## 2023-12-14 MED ORDER — HYDROXYZINE PAMOATE 25 MG PO CAPS: 25.0000 mg | ORAL_CAPSULE | Freq: Three times a day (TID) | ORAL | 1 refills | Status: AC | PRN

## 2023-12-14 MED ORDER — CYCLOBENZAPRINE HCL 10 MG PO TABS: 10.0000 mg | ORAL_TABLET | Freq: Three times a day (TID) | ORAL | 3 refills | Status: AC | PRN

## 2023-12-14 NOTE — Patient Instructions (Addendum)
 Shingrix is the new shingles shot, 2 shots over 2-6 months, confirm coverage with insurance and document, then can return here for shots with nurse appt or at pharmacy  Annual COVID Tetanus booster  Tylenol/Acetaminophen ES 500 mg tabs, 2 tabs twice daily and can take a third dose daily if needed Meloxicam  15 mg daily

## 2023-12-15 ENCOUNTER — Ambulatory Visit: Payer: Self-pay | Admitting: Family Medicine

## 2023-12-15 ENCOUNTER — Other Ambulatory Visit

## 2023-12-15 ENCOUNTER — Other Ambulatory Visit (HOSPITAL_BASED_OUTPATIENT_CLINIC_OR_DEPARTMENT_OTHER): Payer: Self-pay

## 2023-12-15 DIAGNOSIS — M791 Myalgia, unspecified site: Secondary | ICD-10-CM

## 2023-12-15 DIAGNOSIS — M2550 Pain in unspecified joint: Secondary | ICD-10-CM

## 2023-12-15 DIAGNOSIS — E785 Hyperlipidemia, unspecified: Secondary | ICD-10-CM

## 2023-12-15 DIAGNOSIS — E538 Deficiency of other specified B group vitamins: Secondary | ICD-10-CM | POA: Diagnosis not present

## 2023-12-15 DIAGNOSIS — R739 Hyperglycemia, unspecified: Secondary | ICD-10-CM | POA: Diagnosis not present

## 2023-12-15 DIAGNOSIS — E559 Vitamin D deficiency, unspecified: Secondary | ICD-10-CM | POA: Diagnosis not present

## 2023-12-15 DIAGNOSIS — E079 Disorder of thyroid, unspecified: Secondary | ICD-10-CM | POA: Diagnosis not present

## 2023-12-15 DIAGNOSIS — R7689 Other specified abnormal immunological findings in serum: Secondary | ICD-10-CM

## 2023-12-15 LAB — SEDIMENTATION RATE: Sed Rate: 32 mm/h — ABNORMAL HIGH (ref 0–30)

## 2023-12-15 LAB — CBC WITH DIFFERENTIAL/PLATELET
Basophils Absolute: 0.1 K/uL (ref 0.0–0.1)
Basophils Relative: 1 % (ref 0.0–3.0)
Eosinophils Absolute: 0.3 K/uL (ref 0.0–0.7)
Eosinophils Relative: 4.2 % (ref 0.0–5.0)
HCT: 41.7 % (ref 36.0–46.0)
Hemoglobin: 14 g/dL (ref 12.0–15.0)
Lymphocytes Relative: 23.8 % (ref 12.0–46.0)
Lymphs Abs: 1.5 K/uL (ref 0.7–4.0)
MCHC: 33.6 g/dL (ref 30.0–36.0)
MCV: 95.3 fl (ref 78.0–100.0)
Monocytes Absolute: 0.5 K/uL (ref 0.1–1.0)
Monocytes Relative: 7.7 % (ref 3.0–12.0)
Neutro Abs: 4.1 K/uL (ref 1.4–7.7)
Neutrophils Relative %: 63.3 % (ref 43.0–77.0)
Platelets: 234 K/uL (ref 150.0–400.0)
RBC: 4.37 Mil/uL (ref 3.87–5.11)
RDW: 13.1 % (ref 11.5–15.5)
WBC: 6.5 K/uL (ref 4.0–10.5)

## 2023-12-15 LAB — LIPID PANEL
Cholesterol: 187 mg/dL (ref 0–200)
HDL: 42.1 mg/dL (ref >39.00)
LDL Cholesterol: 124 mg/dL — ABNORMAL HIGH (ref 0–99)
NonHDL: 144.85
Total CHOL/HDL Ratio: 4
Triglycerides: 102 mg/dL (ref 0.0–149.0)
VLDL: 20.4 mg/dL (ref 0.0–40.0)

## 2023-12-15 LAB — VITAMIN B12: Vitamin B-12: 499 pg/mL (ref 211–911)

## 2023-12-15 LAB — COMPREHENSIVE METABOLIC PANEL WITH GFR
ALT: 37 U/L — ABNORMAL HIGH (ref 0–35)
AST: 30 U/L (ref 0–37)
Albumin: 4.5 g/dL (ref 3.5–5.2)
Alkaline Phosphatase: 113 U/L (ref 39–117)
BUN: 15 mg/dL (ref 6–23)
CO2: 25 meq/L (ref 19–32)
Calcium: 9 mg/dL (ref 8.4–10.5)
Chloride: 106 meq/L (ref 96–112)
Creatinine, Ser: 0.72 mg/dL (ref 0.40–1.20)
GFR: 88.88 mL/min (ref >60.00)
Glucose, Bld: 111 mg/dL — ABNORMAL HIGH (ref 70–99)
Potassium: 4.4 meq/L (ref 3.5–5.1)
Sodium: 141 meq/L (ref 135–145)
Total Bilirubin: 0.3 mg/dL (ref 0.2–1.2)
Total Protein: 7.2 g/dL (ref 6.0–8.3)

## 2023-12-15 LAB — TSH: TSH: 4.05 u[IU]/mL (ref 0.35–5.50)

## 2023-12-15 LAB — MAGNESIUM: Magnesium: 2 mg/dL (ref 1.5–2.5)

## 2023-12-15 LAB — HEMOGLOBIN A1C: Hgb A1c MFr Bld: 5.9 % (ref 4.6–6.5)

## 2023-12-15 LAB — HIGH SENSITIVITY CRP: CRP, High Sensitivity: 16.47 mg/L — ABNORMAL HIGH (ref 0.000–5.000)

## 2023-12-15 MED ORDER — COMIRNATY 30 MCG/0.3ML IM SUSY
0.3000 mL | PREFILLED_SYRINGE | Freq: Once | INTRAMUSCULAR | 0 refills | Status: AC
  Filled 2023-12-15: qty 0.3, 1d supply, fill #0

## 2023-12-17 ENCOUNTER — Encounter: Payer: Self-pay | Admitting: Family Medicine
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
## 2023-12-19 DIAGNOSIS — F4312 Post-traumatic stress disorder, chronic: Secondary | ICD-10-CM | POA: Diagnosis not present

## 2023-12-20 LAB — RHEUMATOID FACTOR: Rheumatoid fact SerPl-aCnc: <10 [IU]/mL (ref <14)

## 2023-12-20 LAB — VITAMIN D 1,25 DIHYDROXY
Vitamin D 1, 25 (OH)2 Total: 62 pg/mL (ref 18–72)
Vitamin D2 1, 25 (OH)2: <8 pg/mL
Vitamin D3 1, 25 (OH)2: 62 pg/mL

## 2023-12-20 LAB — ANTI-NUCLEAR AB-TITER (ANA TITER)
ANA TITER: 1:320 {titer} — ABNORMAL HIGH
ANA Titer 1: 1:640 {titer} — ABNORMAL HIGH

## 2023-12-20 LAB — ANA: Anti Nuclear Antibody (ANA): POSITIVE — AB

## 2023-12-26 DIAGNOSIS — F4312 Post-traumatic stress disorder, chronic: Secondary | ICD-10-CM | POA: Diagnosis not present

## 2023-12-29 ENCOUNTER — Encounter: Payer: Self-pay | Admitting: Family Medicine

## 2023-12-31 ENCOUNTER — Other Ambulatory Visit: Payer: Self-pay | Admitting: Family Medicine

## 2023-12-31 DIAGNOSIS — R0789 Other chest pain: Secondary | ICD-10-CM

## 2023-12-31 DIAGNOSIS — U099 Post covid-19 condition, unspecified: Secondary | ICD-10-CM

## 2023-12-31 DIAGNOSIS — I471 Supraventricular tachycardia, unspecified: Secondary | ICD-10-CM

## 2023-12-31 DIAGNOSIS — R002 Palpitations: Secondary | ICD-10-CM

## 2024-01-01 ENCOUNTER — Ambulatory Visit: Attending: Family Medicine | Admitting: Audiologist

## 2024-01-01 DIAGNOSIS — H9193 Unspecified hearing loss, bilateral: Secondary | ICD-10-CM | POA: Insufficient documentation

## 2024-01-01 NOTE — Procedures (Signed)
  Outpatient Audiology and [facility] [address] [place], [place]  [address] [phone]  AUDIOLOGICAL  EVALUATION  NAME: Hailey Cochran     DOB:   1960-09-09      MRN: [id number]                                                                                     DATE: 01/01/2024     REFERENT: Domenica Harlene LABOR, MD STATUS: Outpatient DIAGNOSIS: Decreased Hearing    History: Hailey Cochran was seen for an audiological evaluation due to difficulty understanding people. As an adult Hailey Cochran has been diagnosed with ADHD and Autism. Her whole life she had struggled to hear people clearly. She feels it is likely a deficit in her auditory processing. She also had chronic ear infections as a child.  Hailey Cochran denies pain, pressure, or tinnitus. Hailey Cochran has no significant history of hazardous noise exposure.  Medical history shows no other risk for hearing loss.    Evaluation:  Otoscopy showed a clear view of the tympanic membranes, bilaterally Tympanometry results were consistent with normal middle ear function bilaterally  with hypercompliance in right ear  Audiometric testing was completed using Conventional Audiometry techniques with insert earphones and supraural headphones. Test results are consistent with normal hearing bilaterally  except for a mild loss at Hailey Cochran only. Speech Recognition Thresholds were obtained at 10 dB HL in the right ear and at 10  dB HL in the left ear. Word Recognition Testing was completed at  40dB SL and Hailey Cochran scored 100% in each ear.   Results:  The test results were reviewed with Hailey Cochran. She has essentially normal hearing bilaterally  except for a mild loss at Hailey Cochran.  Audiogram printed and provided to Hailey Cochran.    Recommendations: No further testing is recommended at this time. If speech/language delays or hearing difficulties are observed further audiological testing is recommended.           31  minutes spent testing and counseling on results.   If you have any questions please feel free to contact me at [phone].  Hailey Cochran Au.D.  Audiologist   01/01/2024  1:32 PM  Cc: Domenica Harlene LABOR, MD

## 2024-01-02 DIAGNOSIS — F4312 Post-traumatic stress disorder, chronic: Secondary | ICD-10-CM | POA: Diagnosis not present

## 2024-01-08 ENCOUNTER — Encounter: Payer: Self-pay | Admitting: Family Medicine

## 2024-01-09 DIAGNOSIS — F4312 Post-traumatic stress disorder, chronic: Secondary | ICD-10-CM | POA: Diagnosis not present

## 2024-01-16 ENCOUNTER — Other Ambulatory Visit (HOSPITAL_BASED_OUTPATIENT_CLINIC_OR_DEPARTMENT_OTHER): Payer: Self-pay

## 2024-01-16 DIAGNOSIS — F429 Obsessive-compulsive disorder, unspecified: Secondary | ICD-10-CM | POA: Diagnosis not present

## 2024-01-22 NOTE — Progress Notes (Unsigned)
 Office Visit Note  Patient: Hailey Cochran             Date of Birth: [birth year]           MRN: [id number]             PCP: Domenica Harlene LABOR, MD Referring: Domenica Harlene LABOR, MD Visit Date: 01/23/2024 Occupation: Data Unavailable  Subjective:  Pain in joints and muscles  History of Present Illness: Hailey Cochran is a 63 y.o. female seen for the evaluation of polyarthralgia and positive ANA.  According the patient her symptoms started 1992 with generalized achiness and pains.  Initially she was diagnosed with chronic fatigue syndrome and later with fibromyalgia syndrome.  She states she saw several physicians including rheumatologist in [place] .  She was given different medications but she none of the medications helped and she discontinued.  She states she left overall and pain on the scale of 0-10 about 5-6.  She developed COVID-19 virus infection in December 2020.  She had been experiencing increased pain since then.  She complains of discomfort in her muscles, joints in her bones.  She also has been having neuralgias.  She complains of tingling sensation all over.  She describes discomfort in all of her joints in her bones.  She has generalized muscle achiness and hyperalgesia.  She states she has had elevated LFTs in the past and had to avoid medications. She gives history of dry mouth, dry eyes, palpitations, chronic constipation and diarrhea,depression, anxiety, insomnia, sun sensitivity and Raynaud's phenomenon. She used to work as a runner, broadcasting/film/video and quit working in 1991.  She is left-handed, single, gravida 5, para 2, miscarriages 3.  There is no history of preeclampsia or DVTs.  She does not drink any alcohol and is non-smoker.  She is unable to exercise.  She was accompanied by her Sister Rock today. There is family history of bullous pemphigoid in her father and muscular dystrophy in her maternal uncle.    Activities of Daily Living:  Patient reports  morning stiffness for 0 minutes.   Patient Reports nocturnal pain.  Difficulty dressing/grooming: Denies Difficulty climbing stairs: Reports Difficulty getting out of chair: Denies Difficulty using hands for taps, buttons, cutlery, and/or writing: Reports  Review of Systems  Constitutional:  Positive for fatigue.  HENT:  Positive for mouth dryness. Negative for mouth sores.   Eyes:  Positive for dryness.  Respiratory:  Negative for shortness of breath.   Cardiovascular:  Positive for palpitations.  Gastrointestinal:  Positive for constipation and diarrhea. Negative for blood in stool.  Endocrine: Negative for increased urination.  Genitourinary:  Negative for involuntary urination.  Musculoskeletal:  Positive for joint pain, gait problem, joint pain, myalgias, muscle tenderness and myalgias. Negative for joint swelling, muscle weakness and morning stiffness.  Skin:  Positive for color change and sensitivity to sunlight. Negative for rash and hair loss.  Allergic/Immunologic: Negative for susceptible to infections.  Neurological:  Positive for numbness and headaches. Negative for dizziness.  Hematological:  Negative for swollen glands.  Psychiatric/Behavioral:  Positive for depressed mood and sleep disturbance. The patient is nervous/anxious.     PMFS History:  Patient Active Problem List   Diagnosis Date Noted   Mild mitral regurgitation by prior echocardiogram 10/24/2023   Elevated antinuclear antibody (ANA) level    Elevated liver enzymes    Endometriosis    History of endometriosis    Lumbar herniated disc    Paroxysmal SVT (supraventricular tachycardia) 07/20/2023  Peripheral neuropathy 09/14/2021   Autism spectrum disorder 09/14/2021   Adult ADHD 09/14/2021   SOB (shortness of breath) 09/16/2019   Pulmonary nodule 07/08/2019   Anxiety disorder 07/08/2019   COVID-19 long hauler 07/08/2019   Hyperlipidemia 07/08/2019   Dry eyes 07/27/2016   Hyperglycemia 07/26/2016    Palpitations 07/12/2016   Other constipation 04/03/2016   Family history of hepatitis C 04/03/2016   Preventative health care 02/07/2015   Cervical cancer screening 02/02/2015   Overweight 09/29/2014   Anemia    Vitamin B 12 deficiency    Thyroid  disease    Premature ovarian failure    Vitamin D  deficiency 04/22/2014   Onychomycosis 04/09/2014   Low back pain with left-sided sciatica 04/09/2014   Fibromyalgia 04/09/2014   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    Past Medical History:  Diagnosis Date   Adult ADHD 09/14/2021   Anemia    Anxiety disorder 07/08/2019   complex     Autism spectrum disorder 09/14/2021   Cervical cancer screening 02/02/2015   Menarche at 6, stop at 7.5, restarted at 10 Regular and heavy flow and painful from endocmetriosis No history of abnormal pap in past G5P2, s/p 3 miscarriages, 2 svd No history of abnormal MGM, never had a MGM, no concerns, nursed 8 kids, declines MGM for now No concerns today  gyn surgeries: right oophorectomy for an endometrioma LMP at age 32   COVID-19 long hauler 07/08/2019   Dry eyes 07/27/2016   Elevated antinuclear antibody (ANA) level    Elevated liver enzymes    Elevated liver function tests 04/22/2014   Endometriosis    Family history of hepatitis C 04/03/2016   Fibromyalgia    History of endometriosis    Hyperglycemia 07/26/2016   Hyperlipidemia 07/08/2019   Low back pain with left-sided sciatica 04/09/2014   Lumbar herniated disc    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   Onychomycosis 04/09/2014   Other constipation 04/03/2016   Overweight 09/29/2014   Overweight 09/29/2014   Palpitations 07/12/2016   Paroxysmal SVT (supraventricular tachycardia) 07/20/2023   Peripheral neuropathy 09/14/2021   Premature ovarian failure    30s   Preventative health care 02/07/2015   Pulmonary nodule 07/08/2019   SOB (shortness of breath) 09/16/2019   Thyroid   disease    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 [facility] Thyroid    Vitamin B 12 deficiency    Vitamin D  deficiency 04/22/2014    Family History  Problem Relation Age of Onset   Dementia Mother        lewey body   Leukemia Mother    Diabetes Mother        diet controlled   Hypertension Mother        diet controlled   Parkinson's disease Mother    Hepatitis C Father         2 strains   Cancer Father        hepatocellular carcinoma   Hypertension Father    Bullous pemphigoid Father    Drug abuse Father    Other Sister        fatty liver disease   Hypothyroidism Sister    Gallstones Maternal Aunt    Anemia Maternal Aunt    Muscular dystrophy Maternal Uncle    Muscular dystrophy Paternal Uncle    Diabetes Maternal Grandmother    Dementia Maternal Grandmother    Gallstones Maternal Grandfather    Heart disease Maternal Grandfather        cardiomegaly   Rheum arthritis  Paternal Grandmother    Alzheimer's disease Paternal Grandfather    Hypertension Paternal Grandfather    Single kidney Daughter    Healthy Daughter    Healthy Daughter    Rickets Son    Eczema Son    Past Surgical History:  Procedure Laterality Date   CATARACT EXTRACTION, BILATERAL  2025   SALPINGOOPHORECTOMY Right 03/07/1988   endometriosis   Social History   Tobacco Use   Smoking status: Never    Passive exposure: Never   Smokeless tobacco: Never  Vaping Use   Vaping status: Never Used  Substance Use Topics   Alcohol use: No    Alcohol/week: 0.0 standard drinks of alcohol   Drug use: No   Social History   Social History Narrative   2 biological children 1990- daughter [name]                                    108- [name]   3 miscarraiges       Adopted children:   26 year old- [name]   12 year old amelia   63 year old evan michael   7 yosiah   6 jeshua   6 kaleb daniel      Married- Samuel   Homeschools her children   Enjoys psychologist, forensic, air traffic controller, dancing   Christian, jewish heritage      Immunization History  Administered Date(s) Administered   Influenza, Seasonal, Injecte, Preservative Fre 12/14/2023   Moderna Covid-19 Fall Seasonal Vaccine 19yrs & older 02/14/2022   Moderna Sars-Covid-2 Vaccination 06/23/2019, 07/21/2019, 02/22/2020   Pfizer(Comirnaty)Fall Seasonal Vaccine 12 years and older 12/15/2023   Tdap 03/07/2006     Objective: Vital Signs: BP 135/89 (BP Location: Right Arm, Patient Position: Sitting, Cuff Size: Large)   Pulse 76   Temp 98.1 F (36.7 C)   Resp 17   Ht 5' 10.25 (1.784 m)   Wt 208 lb 12.8 oz (94.7 kg)   BMI 29.75 kg/m    Physical Exam Vitals and nursing note reviewed.  Constitutional:      Appearance: She is well-developed.  HENT:     Head: Normocephalic and atraumatic.  Eyes:     Conjunctiva/sclera: Conjunctivae normal.  Cardiovascular:     Rate and Rhythm: Normal rate and regular rhythm.     Heart sounds: Normal heart sounds.  Pulmonary:     Effort: Pulmonary effort is normal.     Breath sounds: Normal breath sounds.  Abdominal:     General: Bowel sounds are normal.     Palpations: Abdomen is soft.  Musculoskeletal:     Cervical back: Normal range of motion.  Lymphadenopathy:     Cervical: No cervical adenopathy.  Skin:    General: Skin is warm and dry.     Capillary Refill: Capillary refill takes less than 2 seconds.     Comments: She had good capillary refill.  No nailbed capillary changes or sclerodactyly was noted.  Neurological:     Mental Status: She is alert and oriented to person, place, and time.  Psychiatric:        Behavior: Behavior normal.      Musculoskeletal Exam: Cervical, thoracic and lumbar spine were in good range of motion.  There was no SI joint tenderness.  Shoulder joints, elbow joints, wrist joints, MCPs, PIPs and DIPs were in good range of motion with no synovitis.  Hip joints and knee joints were  in good range of motion without any warmth swelling or effusion.  There was no tenderness  over ankles or MTPs.  She had generalized hyperalgesia, positive tender points and hypermobility in her joints.   CDAI Exam: CDAI Score: -- Patient Global: --; Provider Global: -- Swollen: --; Tender: -- Joint Exam 01/23/2024   No joint exam has been documented for this visit   There is currently no information documented on the homunculus. Go to the Rheumatology activity and complete the homunculus joint exam.  Investigation: No additional findings.  Imaging: No results found.  Recent Labs: Lab Results  Component Value Date   WBC 6.5 12/15/2023   HGB 14.0 12/15/2023   PLT 234.0 12/15/2023   NA 141 12/15/2023   K 4.4 12/15/2023   CL 106 12/15/2023   CO2 25 12/15/2023   GLUCOSE 111 (H) 12/15/2023   BUN 15 12/15/2023   CREATININE 0.72 12/15/2023   BILITOT 0.3 12/15/2023   ALKPHOS 113 12/15/2023   AST 30 12/15/2023   ALT 37 (H) 12/15/2023   PROT 7.2 12/15/2023   ALBUMIN 4.5 12/15/2023   CALCIUM 9.0 12/15/2023   December 15, 2023 LDL 124, vitamin D  62, B12 499, TSH 4.05, sed rate 32, CRP 16.47, hemoglobin A1c 5.9, magnesium 2.0, RF negative, ANA 1: 640 nuclear, 1: 320 cytoplasmic Speciality Comments: No specialty comments available.  Procedures:  No procedures performed Allergies: Morphine, Other, and Wasp venom   Assessment / Plan:     Visit Diagnoses: Positive ANA (antinuclear antibody) -patient was found to have positive ANA on the recent labs.  She gives history of generalized arthralgias, myalgias for many years.  She states the pain increased after the COVID-19 virus infection.  She gives history of fatigue, sicca symptoms, photosensitivity and Raynaud's.  No nailbed capillary changes or sclerodactyly was noted.  Prevalence of positive ANA in 15% of female population was discussed.  I will obtain additional labs today.  She also has history of miscarriages.  Plan: Protein / creatinine ratio, urine, Sedimentation rate, ANA, Anti-scleroderma antibody, RNP Antibody,  Anti-Smith antibody, Sjogrens syndrome-A extractable nuclear antibody, Sjogrens syndrome-B extractable nuclear antibody, Anti-DNA antibody, double-stranded, C3 and C4, Beta-2 glycoprotein antibodies, Cardiolipin antibodies, IgG, IgM, IgA, Thyroid  peroxidase antibody, Thyroglobulin antibody, Anti-smooth muscle antibody, IgG  Polyarthralgia -she complains of pain and discomfort in multiple joints.  No synovitis was noted.  She states the pain has been there since 1992 however the pain symptoms increased after the COVID-19 virus infection in 2020.  Plan: Cyclic citrul peptide antibody, IgG  Dry eyes-she gives history of dry mouth and dry eyes which could be related to medication use.  Elevated sed rate-she was found to have elevated sed rate and elevated CRP.  No synovitis was noted on the examination today.  Elevated LFTs - Patient is on meloxicam .  Patient states she has had elevated LFTs in the past intermittently.  Most of the time the elevated LFTs are related to medication use.  She has seen a gastroenterologist in the past.  Chronic midline low back pain with left-sided sciatica-she has chronic lower back pain.  History of multiple miscarriages -history of miscarriages x 3.  Fibromyalgia -she was diagnosed with fibromyalgia in 1990s while she was living in [place] .  She has taken several medications and seen rheumatologist without much help.  She continues to have generalized pain, hyperalgesia and positive tender points.  Plan: CK  Other fatigue -she complains of chronic fatigue.  She was diagnosed with chronic fatigue syndrome at 1  time.  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  Hypermobility was noted in most of her joints.  Mild skin laxity was noted.  Thyroid  disease -patient states she had hypothyroidism in the past and then the thyroid  functions became normal.  I will check thyroid  antibodies  today.  Other medical problems are listed as follows:  Dyslipidemia  Irritable bowel syndrome with both constipation and diarrhea  Vitamin D  deficiency  Anemia, unspecified type  Vitamin B 12 deficiency  Pulmonary nodule  Paroxysmal SVT (supraventricular tachycardia)  Other polyneuropathy - since COVID-19 virus infection 02/2019.  Adult ADHD  Other specified anxiety disorders  Autism spectrum disorder  Onychomycosis  Overweight  Orders: Orders Placed This Encounter  Procedures   Protein / creatinine ratio, urine   CK   Sedimentation rate   Cyclic citrul peptide antibody, IgG   ANA   Anti-scleroderma antibody   RNP Antibody   Anti-Smith antibody   Sjogrens syndrome-A extractable nuclear antibody   Sjogrens syndrome-B extractable nuclear antibody   Anti-DNA antibody, double-stranded   C3 and C4   Beta-2 glycoprotein antibodies   Cardiolipin antibodies, IgG, IgM, IgA   Serum protein electrophoresis with reflex   Thyroid  peroxidase antibody   Thyroglobulin antibody   Anti-smooth muscle antibody, IgG   No orders of the defined types were placed in this encounter.    Follow-Up Instructions: Return for Polyarthralgia, fibromyalgia.   Maya Nash, MD  Note - This record has been created using Animal nutritionist.  Chart creation errors have been sought, but may not always  have been located. Such creation errors do not reflect on  the standard of medical care.

## 2024-01-23 ENCOUNTER — Ambulatory Visit: Attending: Rheumatology | Admitting: Rheumatology

## 2024-01-23 ENCOUNTER — Encounter: Payer: Self-pay | Admitting: Rheumatology

## 2024-01-23 VITALS — BP 135/89 | HR 76 | Temp 98.1°F | Resp 17 | Ht 70.25 in | Wt 208.8 lb

## 2024-01-23 DIAGNOSIS — M797 Fibromyalgia: Secondary | ICD-10-CM | POA: Diagnosis not present

## 2024-01-23 DIAGNOSIS — M357 Hypermobility syndrome: Secondary | ICD-10-CM | POA: Insufficient documentation

## 2024-01-23 DIAGNOSIS — M5442 Lumbago with sciatica, left side: Secondary | ICD-10-CM | POA: Diagnosis not present

## 2024-01-23 DIAGNOSIS — H04123 Dry eye syndrome of bilateral lacrimal glands: Secondary | ICD-10-CM | POA: Insufficient documentation

## 2024-01-23 DIAGNOSIS — E538 Deficiency of other specified B group vitamins: Secondary | ICD-10-CM | POA: Insufficient documentation

## 2024-01-23 DIAGNOSIS — R700 Elevated erythrocyte sedimentation rate: Secondary | ICD-10-CM | POA: Insufficient documentation

## 2024-01-23 DIAGNOSIS — R5383 Other fatigue: Secondary | ICD-10-CM | POA: Insufficient documentation

## 2024-01-23 DIAGNOSIS — E785 Hyperlipidemia, unspecified: Secondary | ICD-10-CM | POA: Diagnosis not present

## 2024-01-23 DIAGNOSIS — F909 Attention-deficit hyperactivity disorder, unspecified type: Secondary | ICD-10-CM | POA: Insufficient documentation

## 2024-01-23 DIAGNOSIS — F840 Autistic disorder: Secondary | ICD-10-CM | POA: Insufficient documentation

## 2024-01-23 DIAGNOSIS — I471 Supraventricular tachycardia, unspecified: Secondary | ICD-10-CM | POA: Diagnosis not present

## 2024-01-23 DIAGNOSIS — D649 Anemia, unspecified: Secondary | ICD-10-CM | POA: Diagnosis not present

## 2024-01-23 DIAGNOSIS — G6289 Other specified polyneuropathies: Secondary | ICD-10-CM | POA: Insufficient documentation

## 2024-01-23 DIAGNOSIS — R7689 Other specified abnormal immunological findings in serum: Secondary | ICD-10-CM | POA: Insufficient documentation

## 2024-01-23 DIAGNOSIS — K582 Mixed irritable bowel syndrome: Secondary | ICD-10-CM | POA: Insufficient documentation

## 2024-01-23 DIAGNOSIS — B351 Tinea unguium: Secondary | ICD-10-CM | POA: Diagnosis not present

## 2024-01-23 DIAGNOSIS — F418 Other specified anxiety disorders: Secondary | ICD-10-CM | POA: Insufficient documentation

## 2024-01-23 DIAGNOSIS — R911 Solitary pulmonary nodule: Secondary | ICD-10-CM | POA: Insufficient documentation

## 2024-01-23 DIAGNOSIS — E663 Overweight: Secondary | ICD-10-CM | POA: Insufficient documentation

## 2024-01-23 DIAGNOSIS — M2550 Pain in unspecified joint: Secondary | ICD-10-CM | POA: Diagnosis not present

## 2024-01-23 DIAGNOSIS — N96 Recurrent pregnancy loss: Secondary | ICD-10-CM | POA: Insufficient documentation

## 2024-01-23 DIAGNOSIS — G8929 Other chronic pain: Secondary | ICD-10-CM | POA: Diagnosis not present

## 2024-01-23 DIAGNOSIS — E079 Disorder of thyroid, unspecified: Secondary | ICD-10-CM | POA: Diagnosis not present

## 2024-01-23 DIAGNOSIS — E559 Vitamin D deficiency, unspecified: Secondary | ICD-10-CM | POA: Diagnosis not present

## 2024-01-23 DIAGNOSIS — N809 Endometriosis, unspecified: Secondary | ICD-10-CM

## 2024-01-23 DIAGNOSIS — R7989 Other specified abnormal findings of blood chemistry: Secondary | ICD-10-CM | POA: Insufficient documentation

## 2024-01-23 DIAGNOSIS — E2839 Other primary ovarian failure: Secondary | ICD-10-CM

## 2024-01-25 ENCOUNTER — Encounter: Payer: Self-pay | Admitting: Rheumatology

## 2024-01-26 ENCOUNTER — Ambulatory Visit: Payer: Self-pay | Admitting: Rheumatology

## 2024-01-26 LAB — CARDIOLIPIN ANTIBODIES, IGG, IGM, IGA
Anticardiolipin IgA: <2 [APL'U]/mL (ref <20.0)
Anticardiolipin IgG: <2 [GPL'U]/mL (ref <20.0)
Anticardiolipin IgM: <2 [MPL'U]/mL (ref <20.0)

## 2024-01-26 LAB — PROTEIN ELECTROPHORESIS, SERUM, WITH REFLEX
Albumin ELP: 4.4 g/dL (ref 3.8–4.8)
Alpha 1: 0.3 g/dL (ref 0.2–0.3)
Alpha 2: 0.8 g/dL (ref 0.5–0.9)
Beta 2: 0.5 g/dL (ref 0.2–0.5)
Beta Globulin: 0.5 g/dL (ref 0.4–0.6)
Gamma Globulin: 1.1 g/dL (ref 0.8–1.7)
Total Protein: 7.5 g/dL (ref 6.1–8.1)

## 2024-01-26 LAB — CYCLIC CITRUL PEPTIDE ANTIBODY, IGG: Cyclic Citrullin Peptide Ab: <16 U

## 2024-01-26 LAB — PROTEIN / CREATININE RATIO, URINE
Creatinine, Urine: 209 mg/dL (ref 20–275)
Protein/Creat Ratio: 77 mg/g{creat} (ref 24–184)
Protein/Creatinine Ratio: 0.077 mg/mg{creat} (ref 0.024–0.184)
Total Protein, Urine: 16 mg/dL (ref 5–24)

## 2024-01-26 LAB — ANTI-NUCLEAR AB-TITER (ANA TITER)
ANA TITER: 1:640 {titer} — ABNORMAL HIGH
ANA Titer 1: 1:80 {titer} — ABNORMAL HIGH

## 2024-01-26 LAB — RNP ANTIBODY: Ribonucleic Protein(ENA) Antibody, IgG: <1 AI

## 2024-01-26 LAB — ANTI-SMOOTH MUSCLE ANTIBODY, IGG: Actin (Smooth Muscle) Antibody (IGG): <20 U (ref <20)

## 2024-01-26 LAB — CK: Total CK: 87 U/L (ref 20–243)

## 2024-01-26 LAB — C3 AND C4
C3 Complement: 179 mg/dL (ref 83–193)
C4 Complement: 33 mg/dL (ref 15–57)

## 2024-01-26 LAB — ANTI-SCLERODERMA ANTIBODY: Scleroderma (Scl-70) (ENA) Antibody, IgG: <1 AI

## 2024-01-26 LAB — ANTI-DNA ANTIBODY, DOUBLE-STRANDED: ds DNA Ab: <1 [IU]/mL

## 2024-01-26 LAB — ANA: Anti Nuclear Antibody (ANA): POSITIVE — AB

## 2024-01-26 LAB — SJOGRENS SYNDROME-A EXTRACTABLE NUCLEAR ANTIBODY: SSA (Ro) (ENA) Antibody, IgG: <1 AI

## 2024-01-26 LAB — SEDIMENTATION RATE: Sed Rate: 22 mm/h (ref 0–30)

## 2024-01-26 LAB — BETA-2 GLYCOPROTEIN ANTIBODIES
Beta-2 Glyco 1 IgA: <2 U/mL (ref <20.0)
Beta-2 Glyco 1 IgM: <2 U/mL (ref <20.0)
Beta-2 Glyco I IgG: <2 U/mL (ref <20.0)

## 2024-01-26 LAB — ANTI-SMITH ANTIBODY: ENA SM Ab Ser-aCnc: <1 AI

## 2024-01-26 LAB — SJOGRENS SYNDROME-B EXTRACTABLE NUCLEAR ANTIBODY: SSB (La) (ENA) Antibody, IgG: <1 AI

## 2024-01-26 LAB — THYROID PEROXIDASE ANTIBODY: Thyroperoxidase Ab SerPl-aCnc: 1 [IU]/mL (ref <9)

## 2024-01-26 LAB — THYROGLOBULIN ANTIBODY: Thyroglobulin Ab: <1 [IU]/mL (ref <=1)

## 2024-01-26 NOTE — Progress Notes (Signed)
 Urine protein creatinine ratio normal, CK normal, sed rate normal, anti-CCP negative,ANA remains positive, ENA panel negative, complements normal, thyroid  peroxidase and thyroglobulin antibodies negative.  Anti-smooth muscle antibody negative.  Beta-2  GP 1 and anticardiolipin antibodies are pending.  I will discuss results at the follow-up visit.

## 2024-01-26 NOTE — Progress Notes (Signed)
 ANA low titer positive, all other labs are within normal limits.  I will discuss results at the follow-up visit.

## 2024-01-30 DIAGNOSIS — F332 Major depressive disorder, recurrent severe without psychotic features: Secondary | ICD-10-CM | POA: Diagnosis not present

## 2024-02-05 NOTE — Telephone Encounter (Signed)
 Please ask patient if she has seen a gastroenterologist before for the evaluation of dysphagia.  If not then we can refer her to gastroenterologist for the evaluation of dysphagia and possible reflux.

## 2024-02-06 DIAGNOSIS — F429 Obsessive-compulsive disorder, unspecified: Secondary | ICD-10-CM | POA: Diagnosis not present

## 2024-02-07 ENCOUNTER — Encounter: Payer: Self-pay | Admitting: Family Medicine

## 2024-02-08 NOTE — Progress Notes (Unsigned)
 Office Visit Note  Patient: Hailey Cochran             Date of Birth: [birth year]           MRN: [id number]             PCP: Domenica Harlene LABOR, MD Referring: Domenica Harlene LABOR, MD Visit Date: 02/22/2024 Occupation: Data Unavailable  Subjective:  Pain in joints  History of Present Illness: Hailey Cochran is a 63 y.o. female with polyarthralgia and positive ANA.  She returns today for follow-up visit.  She states she continues to have some generalized achiness in her joints and muscles.  She continues to have insomnia and fatigue.  She also gives history of Raynaud's phenomenon.  She denies any history of digital ulcers.  She denies any history of oral ulcers, nasal ulcers, malar rash or lymphadenopathy.  She states she did some reading on hypermobility and realized that she had hypermobility all of her life.    Activities of Daily Living:  Patient reports morning stiffness for 1 hour.   Patient Reports nocturnal pain.  Difficulty dressing/grooming: Reports Difficulty climbing stairs: Reports Difficulty getting out of chair: Reports Difficulty using hands for taps, buttons, cutlery, and/or writing: Denies  Review of Systems  Constitutional:  Positive for fatigue.  HENT:  Positive for mouth dryness. Negative for mouth sores.   Eyes:  Positive for dryness.  Respiratory:  Positive for shortness of breath.   Cardiovascular:  Positive for chest pain and palpitations.  Gastrointestinal:  Positive for constipation and diarrhea. Negative for blood in stool.  Endocrine: Negative for increased urination.  Genitourinary:  Negative for involuntary urination.  Musculoskeletal:  Positive for joint pain, gait problem, joint pain, myalgias, morning stiffness, muscle tenderness and myalgias. Negative for joint swelling and muscle weakness.  Skin:  Positive for color change, hair loss and sensitivity to sunlight. Negative for rash.  Allergic/Immunologic: Positive  for susceptible to infections.  Neurological:  Positive for dizziness, numbness and headaches.  Hematological:  Negative for swollen glands.  Psychiatric/Behavioral:  Positive for depressed mood and sleep disturbance. The patient is nervous/anxious.     PMFS History:  Patient Active Problem List   Diagnosis Date Noted   Mild mitral regurgitation by prior echocardiogram 10/24/2023   Elevated antinuclear antibody (ANA) level    Elevated liver enzymes    Endometriosis    History of endometriosis    Lumbar herniated disc    Paroxysmal SVT (supraventricular tachycardia) 07/20/2023   Peripheral neuropathy 09/14/2021   Autism spectrum disorder 09/14/2021   Adult ADHD 09/14/2021   SOB (shortness of breath) 09/16/2019   Pulmonary nodule 07/08/2019   Anxiety disorder 07/08/2019   COVID-19 long hauler 07/08/2019   Hyperlipidemia 07/08/2019   Dry eyes 07/27/2016   Hyperglycemia 07/26/2016   Palpitations 07/12/2016   Other constipation 04/03/2016   Family history of hepatitis C 04/03/2016   Preventative health care 02/07/2015   Cervical cancer screening 02/02/2015   Overweight 09/29/2014   Anemia    Vitamin B 12 deficiency    Thyroid  disease    Premature ovarian failure    Vitamin D  deficiency 04/22/2014   Onychomycosis 04/09/2014   Low back pain with left-sided sciatica 04/09/2014   Fibromyalgia 04/09/2014   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    Past Medical History:  Diagnosis Date   Adult ADHD 09/14/2021   Anemia    Anxiety disorder 07/08/2019   complex     Autism spectrum disorder 09/14/2021  Cervical cancer screening 02/02/2015   Menarche at 6, stop at 7.5, restarted at 10 Regular and heavy flow and painful from endocmetriosis No history of abnormal pap in past G5P2, s/p 3 miscarriages, 2 svd No history of abnormal MGM, never had a MGM, no concerns, nursed 8 kids, declines MGM for now No concerns today  gyn surgeries: right oophorectomy  for an endometrioma LMP at age 41   COVID-19 long hauler 07/08/2019   Dry eyes 07/27/2016   Elevated antinuclear antibody (ANA) level    Elevated liver enzymes    Elevated liver function tests 04/22/2014   Endometriosis    Family history of hepatitis C 04/03/2016   Fibromyalgia    History of endometriosis    Hyperglycemia 07/26/2016   Hyperlipidemia 07/08/2019   Low back pain with left-sided sciatica 04/09/2014   Lumbar herniated disc    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   Onychomycosis 04/09/2014   Other constipation 04/03/2016   Overweight 09/29/2014   Overweight 09/29/2014   Palpitations 07/12/2016   Paroxysmal SVT (supraventricular tachycardia) 07/20/2023   Peripheral neuropathy 09/14/2021   Premature ovarian failure    30s   Preventative health care 02/07/2015   Pulmonary nodule 07/08/2019   SOB (shortness of breath) 09/16/2019   Thyroid  disease    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 [facility] Thyroid    Vitamin B 12 deficiency    Vitamin D  deficiency 04/22/2014    Family History  Problem Relation Age of Onset   Dementia Mother        lewey body   Leukemia Mother    Diabetes Mother        diet controlled   Hypertension Mother        diet controlled   Parkinson's disease Mother    Hepatitis C Father         2 strains   Cancer Father        hepatocellular carcinoma   Hypertension Father    Bullous pemphigoid Father    Drug abuse Father    Other Sister        fatty liver disease   Hypothyroidism Sister    Gallstones Maternal Aunt    Anemia Maternal Aunt    Muscular dystrophy Maternal Uncle    Muscular dystrophy Paternal Uncle    Diabetes Maternal Grandmother    Dementia Maternal Grandmother    Gallstones Maternal Grandfather    Heart disease Maternal Grandfather        cardiomegaly   Rheum arthritis Paternal Grandmother    Alzheimer's disease Paternal Grandfather    Hypertension Paternal Grandfather    Single kidney Daughter     Healthy Daughter    Healthy Daughter    Rickets Son    Eczema Son    Past Surgical History:  Procedure Laterality Date   CATARACT EXTRACTION, BILATERAL  2025   SALPINGOOPHORECTOMY Right 03/07/1988   endometriosis   Social History   Tobacco Use   Smoking status: Never    Passive exposure: Never   Smokeless tobacco: Never  Vaping Use   Vaping status: Never Used  Substance Use Topics   Alcohol use: No    Alcohol/week: 0.0 standard drinks of alcohol   Drug use: No   Social History   Social History Narrative   2 biological children 1990- daughter Laurin  1993- [name]   3 miscarraiges       Adopted children:   70 year old- [name]   57 year old amelia   63 year old evan michael   7 yosiah   6 jeshua   6 kaleb daniel      Married- Samuel   Homeschools her children   Enjoys psychologist, forensic, air traffic controller, dancing   Christian, jewish heritage     Immunization History  Administered Date(s) Administered   Influenza, Seasonal, Injecte, Preservative Fre 12/14/2023   Moderna Covid-19 Fall Seasonal Vaccine 25yrs & older 02/14/2022   Moderna Sars-Covid-2 Vaccination 06/23/2019, 07/21/2019, 02/22/2020   Pfizer(Comirnaty )Fall Seasonal Vaccine 12 years and older 12/15/2023   Tdap 03/07/2006     Objective: Vital Signs: BP 114/76   Pulse 73   Temp 97.7 F (36.5 C)   Resp 16   Ht 5' 11 (1.803 m)   Wt 210 lb (95.3 kg)   BMI 29.29 kg/m    Physical Exam Vitals and nursing note reviewed.  Constitutional:      Appearance: She is well-developed.  HENT:     Head: Normocephalic and atraumatic.  Eyes:     Conjunctiva/sclera: Conjunctivae normal.  Cardiovascular:     Rate and Rhythm: Normal rate and regular rhythm.     Heart sounds: Normal heart sounds.  Pulmonary:     Effort: Pulmonary effort is normal.     Breath sounds: Normal breath sounds.  Abdominal:     General: Bowel sounds are normal.     Palpations: Abdomen is soft.   Musculoskeletal:     Cervical back: Normal range of motion.  Skin:    General: Skin is warm and dry.     Capillary Refill: Capillary refill takes less than 2 seconds.  Neurological:     Mental Status: She is alert and oriented to person, place, and time.  Psychiatric:        Behavior: Behavior normal.      Musculoskeletal Exam: Cervical, thoracic and lumbar spine were in good range of motion.  There was no SI joint tenderness.  Shoulder joints, elbow joints, wrist joints, MCPs, PIPs and DIPs were in good range of motion with no synovitis.  Hip joints and knee joints were in good range of motion without any warmth swelling or effusion.  There was no tenderness over ankles or MTPs.  Generalized hyperalgesia and positive tender points were noted.  Hypermobility was noted.  CDAI Exam: CDAI Score: -- Patient Global: --; Provider Global: -- Swollen: --; Tender: -- Joint Exam 02/22/2024   No joint exam has been documented for this visit   There is currently no information documented on the homunculus. Go to the Rheumatology activity and complete the homunculus joint exam.  Investigation: No additional findings.  Imaging: No results found.  Recent Labs: Lab Results  Component Value Date   WBC 6.5 12/15/2023   HGB 14.0 12/15/2023   PLT 234.0 12/15/2023   NA 141 12/15/2023   K 4.4 12/15/2023   CL 106 12/15/2023   CO2 25 12/15/2023   GLUCOSE 111 (H) 12/15/2023   BUN 15 12/15/2023   CREATININE 0.72 12/15/2023   BILITOT 0.3 12/15/2023   ALKPHOS 113 12/15/2023   AST 30 12/15/2023   ALT 37 (H) 12/15/2023   PROT 7.5 01/23/2024   ALBUMIN 4.5 12/15/2023   CALCIUM 9.0 12/15/2023   January 23 2024 ANA 1: 80 cytoplasmic, 1: 640 nuclear , ENA (SCL 70, RNP, Smith, SSA, SSB, dsDNA) negative, C3-C4 normal,  anticardiolipin negative, beta-2  GP 1 negative, urine protein creatinine ratio normal, SPEP normal, TPO antibody negative, thyroglobulin antibody negative, actin antibody negative, sed  rate 22, CK 87  December 15, 2023 LDL 124, vitamin D  62, B12 499, TSH 4.05, sed rate 32, CRP 16.47, hemoglobin A1c 5.9, magnesium 2.0, RF negative, ANA 1: 640 nuclear, 1: 320 cytoplasmic   Speciality Comments: No specialty comments available.  Procedures:  No procedures performed Allergies: Morphine, Other, and Wasp venom   Assessment / Plan:     Visit Diagnoses: Positive ANA (antinuclear antibody) - Arthralgias and myalgias for many years.  Pain got worse after COVID-19 virus infection.  Fatigue, sicca symptoms, photosensitivity and Raynaud's.January 23 2024 ANA 1: 80 cytoplasmic, 1: 640 nuclear , ENA (SCL 70, RNP, Smith, SSA, SSB, dsDNA) negative, C3-C4 normal, anticardiolipin negative, beta-2  GP 1 negative, urine protein creatinine ratio normal, SPEP normal, TPO antibody negative, thyroglobulin antibody negative, actin antibody negative, sed rate 22, CK 87.  Lab results were discussed with patient at length.  I advised her to contact me if she develops any new symptoms.  At this time she does not have the symptoms of lupus or related disease.  Raynaud's disease without gangrene-has history of Raynaud's phenomenon for many years.  No nailbed capillary changes, sclerodactyly or telangiectasia were noted.  She had good capillary refill.  Keeping core temperature warm and warm clothing was discussed.  Polyarthralgia -she complains of pain and discomfort in almost all of her joints.  No synovitis was noted.  Dry eyes - History of dry mouth and dry eyes.  The counter products were discussed.  Elevated LFTs-improving.  Chronic midline low back pain with left-sided sciatica-she continues to have some lower back pain.  History of multiple miscarriages - Miscarriages x 3.  Anticardiolipin antibody and beta-2  GP 1 were negative.  Fibromyalgia -she gives history of generalized pain, hyperalgesia, positive tender points.  She failed several medications in the past.  Benefits of water aerobics,  swimming and stretching were discussed.  Regular exercise was emphasized.  Other fatigue-related to fibromyalgia.  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  I will refer her to physical therapy.  Other medical problems are listed as follows:  Dyslipidemia  Irritable bowel syndrome with both constipation and diarrhea  Vitamin D  deficiency  Vitamin B 12 deficiency  Anemia, unspecified type  Pulmonary nodule  Paroxysmal SVT (supraventricular tachycardia)  Other polyneuropathy  Thyroid  disease  Adult ADHD  Other specified anxiety disorders  Autism spectrum disorder  Onychomycosis  Overweight  Orders: No orders of the defined types were placed in this encounter.  No orders of the defined types were placed in this encounter.    Follow-Up Instructions: Return for +ANA, FMS.   Maya Nash, MD  Note - This record has been created using Animal nutritionist.  Chart creation errors have been sought, but may not always  have been located. Such creation errors do not reflect on  the standard of medical care.

## 2024-02-13 DIAGNOSIS — F4312 Post-traumatic stress disorder, chronic: Secondary | ICD-10-CM | POA: Diagnosis not present

## 2024-02-20 DIAGNOSIS — F4312 Post-traumatic stress disorder, chronic: Secondary | ICD-10-CM | POA: Diagnosis not present

## 2024-02-22 ENCOUNTER — Encounter: Payer: Self-pay | Admitting: Rheumatology

## 2024-02-22 ENCOUNTER — Ambulatory Visit: Admitting: Rheumatology

## 2024-02-22 VITALS — BP 114/76 | HR 73 | Temp 97.7°F | Resp 16 | Ht 71.0 in | Wt 210.0 lb

## 2024-02-22 DIAGNOSIS — I7300 Raynaud's syndrome without gangrene: Secondary | ICD-10-CM | POA: Insufficient documentation

## 2024-02-22 DIAGNOSIS — E079 Disorder of thyroid, unspecified: Secondary | ICD-10-CM | POA: Diagnosis not present

## 2024-02-22 DIAGNOSIS — F909 Attention-deficit hyperactivity disorder, unspecified type: Secondary | ICD-10-CM | POA: Insufficient documentation

## 2024-02-22 DIAGNOSIS — B351 Tinea unguium: Secondary | ICD-10-CM | POA: Diagnosis not present

## 2024-02-22 DIAGNOSIS — M5442 Lumbago with sciatica, left side: Secondary | ICD-10-CM | POA: Diagnosis not present

## 2024-02-22 DIAGNOSIS — R911 Solitary pulmonary nodule: Secondary | ICD-10-CM | POA: Diagnosis not present

## 2024-02-22 DIAGNOSIS — E785 Hyperlipidemia, unspecified: Secondary | ICD-10-CM | POA: Diagnosis not present

## 2024-02-22 DIAGNOSIS — R7989 Other specified abnormal findings of blood chemistry: Secondary | ICD-10-CM | POA: Diagnosis not present

## 2024-02-22 DIAGNOSIS — M797 Fibromyalgia: Secondary | ICD-10-CM

## 2024-02-22 DIAGNOSIS — E663 Overweight: Secondary | ICD-10-CM | POA: Insufficient documentation

## 2024-02-22 DIAGNOSIS — K582 Mixed irritable bowel syndrome: Secondary | ICD-10-CM | POA: Diagnosis not present

## 2024-02-22 DIAGNOSIS — E538 Deficiency of other specified B group vitamins: Secondary | ICD-10-CM | POA: Diagnosis not present

## 2024-02-22 DIAGNOSIS — E559 Vitamin D deficiency, unspecified: Secondary | ICD-10-CM | POA: Diagnosis not present

## 2024-02-22 DIAGNOSIS — R5383 Other fatigue: Secondary | ICD-10-CM | POA: Diagnosis not present

## 2024-02-22 DIAGNOSIS — G8929 Other chronic pain: Secondary | ICD-10-CM | POA: Insufficient documentation

## 2024-02-22 DIAGNOSIS — F418 Other specified anxiety disorders: Secondary | ICD-10-CM | POA: Diagnosis not present

## 2024-02-22 DIAGNOSIS — N96 Recurrent pregnancy loss: Secondary | ICD-10-CM | POA: Insufficient documentation

## 2024-02-22 DIAGNOSIS — M2550 Pain in unspecified joint: Secondary | ICD-10-CM | POA: Diagnosis not present

## 2024-02-22 DIAGNOSIS — D649 Anemia, unspecified: Secondary | ICD-10-CM | POA: Diagnosis not present

## 2024-02-22 DIAGNOSIS — H04123 Dry eye syndrome of bilateral lacrimal glands: Secondary | ICD-10-CM | POA: Insufficient documentation

## 2024-02-22 DIAGNOSIS — G6289 Other specified polyneuropathies: Secondary | ICD-10-CM | POA: Diagnosis not present

## 2024-02-22 DIAGNOSIS — F840 Autistic disorder: Secondary | ICD-10-CM | POA: Insufficient documentation

## 2024-02-22 DIAGNOSIS — I471 Supraventricular tachycardia, unspecified: Secondary | ICD-10-CM

## 2024-02-22 DIAGNOSIS — M357 Hypermobility syndrome: Secondary | ICD-10-CM | POA: Diagnosis not present

## 2024-02-22 DIAGNOSIS — R7689 Other specified abnormal immunological findings in serum: Secondary | ICD-10-CM | POA: Diagnosis not present

## 2024-02-22 NOTE — Patient Instructions (Signed)
 Raynaud's Phenomenon  Raynaud's phenomenon is a condition that affects the blood vessels (arteries) that carry blood to the fingers and toes. The arteries that supply blood to the ears, lips, nipples, or the tip of the nose might also be affected. Raynaud's phenomenon causes the arteries to become narrow temporarily (spasm). As a result, the flow of blood to the affected areas is temporarily decreased. This usually occurs in response to cold temperatures or stress. During an attack, the skin in the affected areas turns white, then blue, and finally red. A person may also feel tingling or numbness in those areas. Attacks usually last for only a brief period, and then the blood flow to the area returns to normal. In most cases, Raynaud's phenomenon does not cause serious health problems. What are the causes? In many cases, the cause of this condition is not known. The condition may occur on its own (primary Raynaud's phenomenon) or may be associated with other diseases or factors (secondary Raynaud's phenomenon). Possible causes may include: Diseases or medical conditions that damage the arteries. Injuries and repetitive actions that hurt the hands or feet. Being exposed to certain chemicals. Taking medicines that narrow the arteries. Other medical conditions, such as lupus, scleroderma, rheumatoid arthritis, thyroid problems, blood disorders, Sjogren syndrome, or atherosclerosis. What increases the risk? The following factors may make you more likely to develop this condition: Being 18-78 years old. Being female. Having a family history of Raynaud's phenomenon. Living in a cold climate. Smoking. What are the signs or symptoms? Symptoms of this condition usually occur when you are exposed to cold temperatures or when you have emotional stress. The symptoms may last for a few minutes or up to several hours. They usually affect your fingers but may also affect your toes, nipples, lips, ears, or the  tip of your nose. Symptoms may include: Changes in skin color. The skin in the affected areas will turn pale or white. The skin may then change from white to bluish to red as normal blood flow returns to the area. Numbness, tingling, or pain in the affected areas. In severe cases, symptoms may include: Skin sores. Tissues decaying and dying (gangrene). How is this diagnosed? This condition may be diagnosed based on: Your symptoms and medical history. A physical exam. During the exam, you may be asked to put your hands in cold water to check for a reaction to cold temperature. Tests, such as: Blood tests to check for other diseases or conditions. A test to check the movement of blood through your arteries and veins (vascular ultrasound). A test in which the skin at the base of your fingernail is examined under a microscope (nailfold capillaroscopy). How is this treated? During an episode, you can take actions to help symptoms go away faster. Options include moving your arms around in a windmill pattern, warming your fingers under warm water, or placing your fingers in a warm body fold, such as your armpit. Long-term treatment for this condition often involves making lifestyle changes and taking steps to control your exposure to cold temperature. For more severe cases, medicine (calcium channel blockers) may be used to improve blood circulation. Follow these instructions at home: Avoiding cold temperatures Take these steps to avoid exposure to cold: If possible, stay indoors during cold weather. When you go outside during cold weather, dress in layers and wear mittens, a hat, a scarf, and warm footwear. Wear mittens or gloves when handling ice or frozen food. Use holders for glasses or cans containing  cold drinks. Let warm water run for a while before taking a shower or bath. Warm up the car before driving in cold weather. Lifestyle If possible, avoid stressful and emotional situations. Try  to find ways to manage your stress, such as: Exercise. Yoga. Meditation. Biofeedback. Do not use any products that contain nicotine or tobacco. These products include cigarettes, chewing tobacco, and vaping devices, such as e-cigarettes. If you need help quitting, ask your health care provider. Avoid secondhand smoke. Limit your use of caffeine. Switch to decaffeinated coffee, tea, and soda. Avoid chocolate. Avoid vibrating tools and machinery. General instructions Protect your hands and feet from injuries, cuts, or bruises. Avoid wearing tight rings or wristbands. Wear loose fitting socks and comfortable, roomy shoes. Take over-the-counter and prescription medicines only as told by your health care provider. Where to find support Raynaud's Association: www.raynauds.org Where to find more information [employer] of Arthritis and Musculoskeletal and Skin Diseases: www.niams.http://www.myers.net/ Contact a health care provider if: Your discomfort becomes worse despite lifestyle changes. You develop sores on your fingers or toes that do not heal. You have breaks in the skin on your fingers or toes. You have a fever. You have pain or swelling in your joints. You have a rash. Your symptoms occur on only one side of your body. Get help right away if: Your fingers or toes turn black. You have severe pain in the affected areas. These symptoms may represent a serious problem that is an emergency. Do not wait to see if the symptoms will go away. Get medical help right away. Call your local emergency services (911 in the [place] Do not drive yourself to the hospital. Summary Raynaud's phenomenon is a condition that affects the arteries that carry blood to the fingers, toes, ears, lips, nipples, or the tip of the nose. In many cases, the cause of this condition is not known. Symptoms of this condition include changes in skin color along with numbness and tingling in the affected area. Treatment for  this condition includes lifestyle changes and reducing exposure to cold temperatures. Medicines may be used for severe cases of the condition. Contact your health care provider if your condition worsens despite treatment. This information is not intended to replace advice given to you by your health care provider. Make sure you discuss any questions you have with your health care provider. Document Revised: 04/28/2020 Document Reviewed: 04/28/2020 Elsevier Patient Education  2024 [employer].

## 2024-03-10 ENCOUNTER — Encounter: Payer: Self-pay | Admitting: Rheumatology

## 2024-03-10 DIAGNOSIS — M2550 Pain in unspecified joint: Secondary | ICD-10-CM

## 2024-03-10 DIAGNOSIS — Z7689 Persons encountering health services in other specified circumstances: Secondary | ICD-10-CM

## 2024-03-11 NOTE — Telephone Encounter (Signed)
 Patient is requesting dermatology referral and also said Also, the shoulder/joint pain is worse. We discussed my Hypermobility issues, should I go to an orthopedist for the shoulder pain?

## 2024-03-11 NOTE — Telephone Encounter (Signed)
 Please refer her to dermatology and orthopedics.

## 2024-03-12 ENCOUNTER — Ambulatory Visit: Attending: Family Medicine

## 2024-03-12 DIAGNOSIS — M25552 Pain in left hip: Secondary | ICD-10-CM | POA: Diagnosis present

## 2024-03-12 DIAGNOSIS — M5459 Other low back pain: Secondary | ICD-10-CM | POA: Diagnosis present

## 2024-03-12 DIAGNOSIS — M25551 Pain in right hip: Secondary | ICD-10-CM | POA: Diagnosis present

## 2024-03-12 DIAGNOSIS — M797 Fibromyalgia: Secondary | ICD-10-CM | POA: Diagnosis not present

## 2024-03-12 DIAGNOSIS — R262 Difficulty in walking, not elsewhere classified: Secondary | ICD-10-CM | POA: Diagnosis present

## 2024-03-12 DIAGNOSIS — M6281 Muscle weakness (generalized): Secondary | ICD-10-CM | POA: Insufficient documentation

## 2024-03-12 DIAGNOSIS — M357 Hypermobility syndrome: Secondary | ICD-10-CM | POA: Insufficient documentation

## 2024-03-12 NOTE — Therapy (Signed)
 " OUTPATIENT PHYSICAL THERAPY THORACOLUMBAR EVALUATION   Patient Name: Hailey Cochran MRN: [id number] DOB:04/28/1960, 63 y.o., female Today's Date: 03/14/2024  END OF SESSION:  PT End of Session -03/12/24      Visit Number 1    Number of Visits     Date for Recertification  06/06/24    PT Start Time 1315     PT Stop Time 1400    PT Time Calculation (min) 45 min     Activity Tolerance Patient tolerated treatment well;No increased pain     Behavior During Therapy WFL for tasks assessed/performed      Past Medical History:  Diagnosis Date   Adult ADHD 09/14/2021   Anemia    Anxiety disorder 07/08/2019   complex     Autism spectrum disorder 09/14/2021   Cervical cancer screening 02/02/2015   Menarche at 6, stop at 7.5, restarted at 10 Regular and heavy flow and painful from endocmetriosis No history of abnormal pap in past G5P2, s/p 3 miscarriages, 2 svd No history of abnormal MGM, never had a MGM, no concerns, nursed 8 kids, declines MGM for now No concerns today  gyn surgeries: right oophorectomy for an endometrioma LMP at age 75   COVID-19 long hauler 07/08/2019   Dry eyes 07/27/2016   Elevated antinuclear antibody (ANA) level    Elevated liver enzymes    Elevated liver function tests 04/22/2014   Endometriosis    Family history of hepatitis C 04/03/2016   Fibromyalgia    History of endometriosis    Hyperglycemia 07/26/2016   Hyperlipidemia 07/08/2019   Low back pain with left-sided sciatica 04/09/2014   Lumbar herniated disc    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   Onychomycosis 04/09/2014   Other constipation 04/03/2016   Overweight 09/29/2014   Overweight 09/29/2014   Palpitations 07/12/2016   Paroxysmal SVT (supraventricular tachycardia) 07/20/2023   Peripheral neuropathy 09/14/2021   Premature ovarian failure    30s   Preventative health care 02/07/2015   Pulmonary nodule 07/08/2019   SOB (shortness of breath)  09/16/2019   Thyroid  disease    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 [employer] Thyroid    Vitamin B 12 deficiency    Vitamin D  deficiency 04/22/2014   Past Surgical History:  Procedure Laterality Date   CATARACT EXTRACTION, BILATERAL  2025   SALPINGOOPHORECTOMY Right 03/07/1988   endometriosis   Patient Active Problem List   Diagnosis Date Noted   Mild mitral regurgitation by prior echocardiogram 10/24/2023   Elevated antinuclear antibody (ANA) level    Elevated liver enzymes    Endometriosis    History of endometriosis    Lumbar herniated disc    Paroxysmal SVT (supraventricular tachycardia) 07/20/2023   Peripheral neuropathy 09/14/2021   Autism spectrum disorder 09/14/2021   Adult ADHD 09/14/2021   SOB (shortness of breath) 09/16/2019   Pulmonary nodule 07/08/2019   Anxiety disorder 07/08/2019   COVID-19 long hauler 07/08/2019   Hyperlipidemia 07/08/2019   Dry eyes 07/27/2016   Hyperglycemia 07/26/2016   Palpitations 07/12/2016   Other constipation 04/03/2016   Family history of hepatitis C 04/03/2016   Preventative health care 02/07/2015   Cervical cancer screening 02/02/2015   Overweight 09/29/2014   Anemia    Vitamin B 12 deficiency    Thyroid  disease    Premature ovarian failure    Vitamin D  deficiency 04/22/2014   Onychomycosis 04/09/2014   Low back pain with left-sided sciatica 04/09/2014   Fibromyalgia 04/09/2014   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  PCP: Domenica Blackbird, MD   REFERRING PROVIDER: Dolphus Reiter, MD  REFERRING DIAG: fibromyalgia, hypermobility syndrome, chronic lower back pain  Rationale for Evaluation and Treatment: Rehabilitation  THERAPY DIAG:  Other low back pain  Muscle weakness (generalized)  Hypermobility syndrome  Difficulty in walking, not elsewhere classified  Pain of both hip joints  ONSET DATE: chronic, several years   SUBJECTIVE:                                                                                                                                                                                            SUBJECTIVE STATEMENT: My world in regard to my function has really diminished recently.  I have flares that occur  that affect my joint pain, easily get costochondritis, tendinitis, such as tasks , wiping counters.  Limited in my lifting capability. Also have gained a lot of weight .  Can't navigate my exercise tolerance   PERTINENT HISTORY:  H/o hypermobility disorder, fibromyalgia,   PAIN:  Are you having pain? Chronic pain, multiple sites, today B hip and shoulder jts, sometimes lower back varies  PRECAUTIONS: Fall  RED FLAGS: None   WEIGHT BEARING RESTRICTIONS: No  FALLS:  Has patient fallen in last 6 months? Yes. Number of falls 3  LIVING ENVIRONMENT: Lives with: lives with their family Lives in: House/apartment Stairs: has bedroom on second floor which she negotiates once a day Has following equipment at home: None  OCCUPATION: disabled has 4 teenage boys   PLOF: Independent with household mobility without device  PATIENT GOALS: be able to function weell enough to cook, maintain her household in order to take care of her children  NEXT MD VISIT: in one year to return to referring rheumatologist   OBJECTIVE:  Note: Objective measures were completed at Evaluation unless otherwise noted.  DIAGNOSTIC FINDINGS:  na  PATIENT SURVEYS:  PSFS: THE PATIENT SPECIFIC FUNCTIONAL SCALE  Place score of 0-10 (0 = unable to perform activity and 10 = able to perform activity at the same level as before injury or problem)  Activity Date: 03/12/24    Able to pull ingredients to cook and move into kitchen for prep work, 2 x day 3    2.able to walk 800' for community distances 2    3.able to move sit to stand from various surfaces 3    4.      Total Score 8/3:2.67      Total Score = Sum of activity scores/number of activities  Minimally Detectable Change: 3  points (for single activity); 2 points (for average score)  [facility] (nd). The Patient Specific  Functional Scale . Retrieved from Skateoasis.com.pt   COGNITION: Overall cognitive status: Within functional limits for tasks assessed     SENSATION: WFL  MUSCLE LENGTH:all excessive  POSTURE: hyperextends knees, mild scoliotic changes R lumbar concavity, increased lumbar lordosis, rounded sloped shoulders  PALPATION: na  LUMBAR ROM: wnl, able to place palms on floor  LOWER EXTREMITY ROM:   B hips, knees, ankles WNL  LOWER EXTREMITY MMT:  gross MMT 3+/5  MMT Right eval Left eval  Hip flexion    Hip extension    Hip abduction    Hip adduction    Hip internal rotation    Hip external rotation    Knee flexion    Knee extension    Ankle dorsiflexion    Ankle plantarflexion    Ankle inversion    Ankle eversion     (Blank rows = not tested)  FUNCTIONAL TESTS:  30 seconds chair stand test   9 reps , c/o dizzy Single leg stance R 4 sec, L 5 sec , marked increased sway  GAIT: Distance walked: in clinic 2' increased sway B hips   TREATMENT DATE: 03/12/24:    Evaluation, instructed in a modified isometric dead bug exercise to address overall jt stability Also instructed in walking program to increase her activity tolerance, provided written instruction, to walk 1 min at a time indoors each day 2 x day, then increase each week.  Advised of the benefits of low intensity cardiovascular training for hypermobilty, and fibromyalgia                                                                                                                              PATIENT EDUCATION:  Education details: POC, goals Person educated: Patient Education method: Explanation, Demonstration, and Tactile cues Education comprehension: verbalized understanding, returned demonstration, verbal cues required, and tactile cues  required  HOME EXERCISE PROGRAM: Access Code: [email or web address] URL: [email or web address] Date: 03/12/2024 Prepared by: Greig Johnni Wunschel  Exercises - Dead Bug  - 1 x daily - 7 x weekly - 3 sets - 10 reps Check out these websites for exercises to try if you have POTS ([employer] Protocol, [place] Protocol, and [employer] Protocol) [email or web address]     ASSESSMENT:  CLINICAL IMPRESSION: Patient is a 64 y.o. female who was evaluated today by physical therapy due to multiple areas of pain with corresponding hypermobility syndrome and fibromyalgia.  Her biggest concerns today upon evaluation are diminished activity tolerance, decreased balance with frequent falls, and overall loss of trunk and core strength.  Her hips and shoulders were particularly painful today.  She should benefit from physical therapy to address her functional deficits and engage her in education for activity modifications as well as graduated ex program to improve her overall stability.   OBJECTIVE IMPAIRMENTS: decreased activity tolerance, decreased balance, decreased endurance, decreased knowledge of condition, difficulty walking, decreased strength, impaired perceived functional ability, improper body mechanics, postural dysfunction, and pain.   ACTIVITY LIMITATIONS:  carrying, lifting, sitting, standing, squatting, stairs, transfers, bed mobility, reach over head, locomotion level, and caring for others  PARTICIPATION LIMITATIONS: meal prep, cleaning, laundry, driving, shopping, community activity, and yard work  PERSONAL FACTORS: Behavior pattern, Fitness, Past/current experiences, Time since onset of injury/illness/exacerbation, Transportation, and 1-2 comorbidities: hypermobility,fibromyalgia, autism are also affecting patient's functional outcome.    REHAB POTENTIAL: Fair will nedd long term engagement and encouragement  CLINICAL DECISION MAKING: Evolving/moderate complexity  EVALUATION COMPLEXITY: Moderate   GOALS: Goals reviewed with patient? Yes  SHORT TERM GOALS: Target date: 2 weeks 03/26/24  I initial HEP Baseline: initiated today Goal status: INITIAL   LONG TERM GOALS: Target date: 06/06/24, 12 weeks  Improve 30 sec sit to stand from 9 to 15 or greater Baseline:  Goal status: INITIAL  2.  Improve single leg stance from 5 sec to 15 sec each let Baseline:  Goal status: INITIAL  3.  Improve PSFS from 2.67 to 6 or greater Baseline:  Goal status: INITIAL  4.  Pt able to sustain consistent walking program and core stability training for long term management Baseline:  Goal status: INITIAL  PLAN:  PT FREQUENCY: every other week  PT DURATION: 12 weeks  PLANNED INTERVENTIONS: 97110-Therapeutic exercises, 97530- Therapeutic activity, [id number]- Neuromuscular re-education, 97535- Self Care, [id number]- Manual therapy, and Patient/Family education.  PLAN FOR NEXT SESSION: review initial ex and modify, add more core stability, hip, trunk strengthening as tolerated   Jiana Lemaire L Ohm Dentler, PT, DPT, OCS 03/14/2024, 5:47 PM ---- "

## 2024-03-14 ENCOUNTER — Other Ambulatory Visit: Payer: Self-pay

## 2024-03-26 ENCOUNTER — Encounter: Payer: Self-pay | Admitting: Family Medicine

## 2024-03-26 ENCOUNTER — Other Ambulatory Visit: Payer: Self-pay

## 2024-03-26 ENCOUNTER — Ambulatory Visit

## 2024-03-26 DIAGNOSIS — M5459 Other low back pain: Secondary | ICD-10-CM | POA: Diagnosis not present

## 2024-03-26 DIAGNOSIS — M357 Hypermobility syndrome: Secondary | ICD-10-CM

## 2024-03-26 DIAGNOSIS — M6281 Muscle weakness (generalized): Secondary | ICD-10-CM

## 2024-03-26 DIAGNOSIS — R262 Difficulty in walking, not elsewhere classified: Secondary | ICD-10-CM

## 2024-03-26 DIAGNOSIS — M25551 Pain in right hip: Secondary | ICD-10-CM

## 2024-03-26 NOTE — Therapy (Signed)
 " OUTPATIENT PHYSICAL THERAPY THORACOLUMBAR EVALUATION   Patient Name: Hailey Cochran MRN: [id number] DOB:January 19, 1961, 64 y.o., female Today's Date: 03/26/2024  END OF SESSION:  PT End of Session - 03/26/24 1314     Visit Number 2    Date for Recertification  06/06/24    PT Start Time 1315    PT Stop Time 1400    PT Time Calculation (min) 45 min    Activity Tolerance Patient tolerated treatment well    Behavior During Therapy [facility] for tasks assessed/performed         PT End of Session -03/12/24      Visit Number 1    Number of Visits     Date for Recertification  06/06/24    PT Start Time 1315     PT Stop Time 1400    PT Time Calculation (min) 45 min     Activity Tolerance Patient tolerated treatment well;No increased pain     Behavior During Therapy WFL for tasks assessed/performed      Past Medical History:  Diagnosis Date   Adult ADHD 09/14/2021   Anemia    Anxiety disorder 07/08/2019   complex     Autism spectrum disorder 09/14/2021   Cervical cancer screening 02/02/2015   Menarche at 6, stop at 7.5, restarted at 10 Regular and heavy flow and painful from endocmetriosis No history of abnormal pap in past G5P2, s/p 3 miscarriages, 2 svd No history of abnormal MGM, never had a MGM, no concerns, nursed 8 kids, declines MGM for now No concerns today  gyn surgeries: right oophorectomy for an endometrioma LMP at age 65   COVID-19 long hauler 07/08/2019   Dry eyes 07/27/2016   Elevated antinuclear antibody (ANA) level    Elevated liver enzymes    Elevated liver function tests 04/22/2014   Endometriosis    Family history of hepatitis C 04/03/2016   Fibromyalgia    History of endometriosis    Hyperglycemia 07/26/2016   Hyperlipidemia 07/08/2019   Low back pain with left-sided sciatica 04/09/2014   Lumbar herniated disc    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   Onychomycosis 04/09/2014   Other constipation 04/03/2016    Overweight 09/29/2014   Overweight 09/29/2014   Palpitations 07/12/2016   Paroxysmal SVT (supraventricular tachycardia) 07/20/2023   Peripheral neuropathy 09/14/2021   Premature ovarian failure    30s   Preventative health care 02/07/2015   Pulmonary nodule 07/08/2019   SOB (shortness of breath) 09/16/2019   Thyroid  disease    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 [employer] Thyroid    Vitamin B 12 deficiency    Vitamin D  deficiency 04/22/2014   Past Surgical History:  Procedure Laterality Date   CATARACT EXTRACTION, BILATERAL  2025   SALPINGOOPHORECTOMY Right 03/07/1988   endometriosis   Patient Active Problem List   Diagnosis Date Noted   Mild mitral regurgitation by prior echocardiogram 10/24/2023   Elevated antinuclear antibody (ANA) level    Elevated liver enzymes    Endometriosis    History of endometriosis    Lumbar herniated disc    Paroxysmal SVT (supraventricular tachycardia) 07/20/2023   Peripheral neuropathy 09/14/2021   Autism spectrum disorder 09/14/2021   Adult ADHD 09/14/2021   SOB (shortness of breath) 09/16/2019   Pulmonary nodule 07/08/2019   Anxiety disorder 07/08/2019   COVID-19 long hauler 07/08/2019   Hyperlipidemia 07/08/2019   Dry eyes 07/27/2016   Hyperglycemia 07/26/2016   Palpitations 07/12/2016   Other constipation 04/03/2016   Family history of  hepatitis C 04/03/2016   Preventative health care 02/07/2015   Cervical cancer screening 02/02/2015   Overweight 09/29/2014   Anemia    Vitamin B 12 deficiency    Thyroid  disease    Premature ovarian failure    Vitamin D  deficiency 04/22/2014   Onychomycosis 04/09/2014   Low back pain with left-sided sciatica 04/09/2014   Fibromyalgia 04/09/2014   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  ONSET DATE: chronic, several years   SUBJECTIVE:                                                                                                                                                                                           SUBJECTIVE STATEMENT:03/26/24:I have been able to utilize the dead bug ex, have been able to increase the length of time I can hold it .  Also I've been trying to walk some for exercise in the house like you recommended and I'm trying to go up and down the stairs more. I see the orthopedist on Monday about my R shoulder   My world in regard to my function has really diminished recently.  I have flares that occur  that affect my joint pain, easily get costochondritis, tendinitis, such as tasks , wiping counters.  Limited in my lifting capability. Also have gained a lot of weight .  Can't navigate my exercise tolerance   PERTINENT HISTORY:  H/o hypermobility disorder, fibromyalgia,   PAIN:  Are you having pain? Chronic pain, multiple sites, today B hip and shoulder jts, sometimes lower back varies  PRECAUTIONS: Fall  RED FLAGS: None   WEIGHT BEARING RESTRICTIONS: No  FALLS:  Has patient fallen in last 6 months? Yes. Number of falls 3  LIVING ENVIRONMENT: Lives with: lives with their family Lives in: House/apartment Stairs: has bedroom on second floor which she negotiates once a day Has following equipment at home: None  OCCUPATION: disabled has 4 teenage boys   PLOF: Independent with household mobility without device  PATIENT GOALS: be able to function weell enough to cook, maintain her household in order to take care of her children  NEXT MD VISIT: in one year to return to referring rheumatologist   OBJECTIVE:  Note: Objective measures were completed at Evaluation unless otherwise noted.  DIAGNOSTIC FINDINGS:  na  PATIENT SURVEYS:  PSFS: THE PATIENT SPECIFIC FUNCTIONAL SCALE  Place  score of 0-10 (0 = unable  to perform activity and 10 = able to perform activity at the same level as before injury or problem)  Activity Date: 03/12/24    Able to pull ingredients to cook and move into kitchen for prep work, 2 x day 3    2.able to walk 800' for community distances 2    3.able to move sit to stand from various surfaces 3    4.      Total Score 8/3:2.67      Total Score = Sum of activity scores/number of activities  Minimally Detectable Change: 3 points (for single activity); 2 points (for average score)  [employer] [facility] [facility] (nd). The Patient Specific Functional Scale . Retrieved from Skateoasis.com.pt   COGNITION: Overall cognitive status: Within functional limits for tasks assessed     SENSATION: WFL  MUSCLE LENGTH:all excessive  POSTURE: hyperextends knees, mild scoliotic changes R lumbar concavity, increased lumbar lordosis, rounded sloped shoulders  PALPATION: na  LUMBAR ROM: wnl, able to place palms on floor  LOWER EXTREMITY ROM:   B hips, knees, ankles WNL  LOWER EXTREMITY MMT:  gross MMT 3+/5  MMT Right eval Left eval  Hip flexion    Hip extension    Hip abduction    Hip adduction    Hip internal rotation    Hip external rotation    Knee flexion    Knee extension    Ankle dorsiflexion    Ankle plantarflexion    Ankle inversion    Ankle eversion     (Blank rows = not tested)  FUNCTIONAL TESTS:  30 seconds chair stand test   9 reps , c/o dizzy Single leg stance R 4 sec, L 5 sec , marked increased sway  GAIT: Distance walked: in clinic 46' increased sway B hips   TREATMENT DATE:  03/26/24:  Therex:  Patient was able to demonstrate her modified dead bug ex effectively Progressed with additional exercise trials with modifications, designed to incorporate multiplanar stability training .  We did attempt seated wand trunk side bending and rotation but minimal feedback and challenge  noted for her trunk so deferred Instructed her in modified quadriped ex, instead of quadriped pt inst to kneel on pillow with forearms on table , and perform alt knee lifts, also alt elbow lifts, and additional thoracic rotation Bridging, modified with alt heel lifts with static hold , advised goal is to build up to 1 min   03/12/24:    Evaluation, instructed in a modified isometric dead bug exercise to address overall jt stability Also instructed in walking program to increase her activity tolerance, provided written instruction, to walk 1 min at a time indoors each day 2 x day, then increase each week.  Advised of the benefits of low intensity cardiovascular training for hypermobilty, and fibromyalgia  PATIENT EDUCATION:  Education details: POC, goals Person educated: Patient Education method: Explanation, Demonstration, and Tactile cues Education comprehension: verbalized understanding, returned demonstration, verbal cues required, and tactile cues required  HOME EXERCISE PROGRAM: Access Code: [email or web address] URL: [email or web address] Date: 03/26/2024 Prepared by: Greig Samreet [place]  Exercises - Dead Bug  - 1 x daily - 7 x weekly - 1 sets - 10 reps - Supine Heel Bridge  - 1 x daily - 7 x weekly - 3 sets - 10 reps - Quadruped on Forearms Hip Extension Alternating  - 1 x daily - 7 x weekly - 3 sets - 10 reps - Quadruped Lift Off to a Floor Squat  - 1 x daily - 7 x weekly - 3 sets - 10 reps - Quadruped Marathon Oil with Resistance  - 1 x daily - 7 x weekly - 3 sets - 10 reps Access Code: YHPMYHEL URL: [email or web address] Date: 03/12/2024 Prepared by: Greig Pura Picinich  Exercises - Dead Bug  - 1 x daily - 7 x weekly - 3 sets - 10 reps Check out these websites for exercises to try if you have POTS ([employer] Protocol, [place] Protocol, and [employer]  Protocol) [email or web address]     ASSESSMENT:  CLINICAL IMPRESSION: Patient is a 65 y.o. female who participated with physical therapy  treatment today, her second session, due to multiple areas of pain with corresponding hypermobility syndrome and fibromyalgia.  We advanced her home routine to incorporated more stability training in multiple planes,  all modified with education to maintain neutral positioning if possible and avoid excessive jt movement. She tolerated very well.  She should benefit from physical therapy to address her functional deficits and engage her in education for activity modifications as well as graduated ex program to improve her overall stability.   OBJECTIVE IMPAIRMENTS: decreased activity tolerance, decreased balance, decreased endurance, decreased knowledge of condition, difficulty walking, decreased strength, impaired perceived functional ability, improper body mechanics, postural dysfunction, and pain.   ACTIVITY LIMITATIONS: carrying, lifting, sitting, standing, squatting, stairs, transfers, bed mobility, reach over head, locomotion level, and caring for others  PARTICIPATION LIMITATIONS: meal prep, cleaning, laundry, driving, shopping, community activity, and yard work  PERSONAL FACTORS: Behavior pattern, Fitness, Past/current experiences, Time since onset of injury/illness/exacerbation, Transportation, and 1-2 comorbidities: hypermobility,fibromyalgia, autism are also affecting patient's functional outcome.   REHAB POTENTIAL: Fair will nedd long term engagement and encouragement  CLINICAL DECISION MAKING: Evolving/moderate complexity  EVALUATION COMPLEXITY: Moderate   GOALS: Goals reviewed with patient? Yes  SHORT TERM GOALS: Target date: 2 weeks 03/26/24  I initial  HEP Baseline: initiated today Goal status: INITIAL   LONG TERM GOALS: Target date: 06/06/24, 12 weeks  Improve 30 sec sit to stand from 9 to 15 or greater Baseline:  Goal status: INITIAL  2.  Improve single leg stance from 5 sec to 15 sec each let Baseline:  Goal status: INITIAL  3.  Improve PSFS from 2.67 to 6 or greater Baseline:  Goal status: INITIAL  4.  Pt able to sustain consistent walking program and core stability training for long term management Baseline:  Goal status: INITIAL  PLAN:  PT FREQUENCY: every other week  PT DURATION: 12 weeks  PLANNED INTERVENTIONS: 97110-Therapeutic exercises, 97530- Therapeutic activity, [id number]- Neuromuscular re-education, 97535- Self Care, 02859- Manual therapy, and Patient/Family education.  PLAN FOR NEXT SESSION: review initial ex and modify, add more core stability, hip, trunk strengthening as tolerated   Mando Blatz L Nikki Rusnak, PT, DPT, OCS 03/26/2024, 3:32 PM ---- "

## 2024-03-29 ENCOUNTER — Ambulatory Visit

## 2024-04-01 ENCOUNTER — Ambulatory Visit: Admitting: Sports Medicine

## 2024-04-05 ENCOUNTER — Encounter: Payer: Self-pay | Admitting: Sports Medicine

## 2024-04-05 ENCOUNTER — Ambulatory Visit: Admitting: Sports Medicine

## 2024-04-05 ENCOUNTER — Other Ambulatory Visit: Payer: Self-pay

## 2024-04-05 ENCOUNTER — Other Ambulatory Visit (INDEPENDENT_AMBULATORY_CARE_PROVIDER_SITE_OTHER)

## 2024-04-05 DIAGNOSIS — G8929 Other chronic pain: Secondary | ICD-10-CM | POA: Diagnosis not present

## 2024-04-05 DIAGNOSIS — M25511 Pain in right shoulder: Secondary | ICD-10-CM | POA: Diagnosis not present

## 2024-04-05 DIAGNOSIS — M357 Hypermobility syndrome: Secondary | ICD-10-CM | POA: Diagnosis not present

## 2024-04-05 DIAGNOSIS — M797 Fibromyalgia: Secondary | ICD-10-CM | POA: Diagnosis not present

## 2024-04-05 MED ORDER — LIDOCAINE HCL 1 % IJ SOLN
2.0000 mL | INTRAMUSCULAR | Status: AC | PRN
  Administered 2024-04-05: 2 mL

## 2024-04-05 MED ORDER — METHYLPREDNISOLONE ACETATE 40 MG/ML IJ SUSP
60.0000 mg | INTRAMUSCULAR | Status: AC | PRN
  Administered 2024-04-05: 60 mg via INTRA_ARTICULAR

## 2024-04-05 MED ORDER — BUPIVACAINE HCL 0.25 % IJ SOLN
2.0000 mL | INTRAMUSCULAR | Status: AC | PRN
  Administered 2024-04-05: 2 mL via INTRA_ARTICULAR

## 2024-04-05 NOTE — Progress Notes (Signed)
 Patient says that she has had right shoulder pain for a couple of months. She says that she has pain across her entire body and has always been hypermobile. She points to the front of the shoulder when describing her pain and says that it does go down the arm. She denies having any numbness or tingling in the arm. She is restricted in her ROM due to pain, and mentions having bad pain when reaching for something. She takes Ibuprofen and Meloxicam  a couple of times per day. She says that she has tried ice and heat, and heat seems to feel better.

## 2024-04-05 NOTE — Progress Notes (Signed)
 "  Hailey Cochran - 64 y.o. female MRN [id number]  Date of birth: 09-08-1960  Office Visit Note: Visit Date: 04/05/2024 PCP: Domenica Harlene LABOR, MD Referred by: Domenica Harlene LABOR, MD  Subjective: Chief Complaint  Patient presents with   Right Shoulder - Pain   HPI: Hailey Cochran is a pleasant 64 y.o. female who presents today for chronic right shoulder pain in setting of joint hypermobility/polyarthralgia.  Lab Results  Component Value Date   HGBA1C 5.9 12/15/2023   Discussed the use of AI scribe software for clinical note transcription with the patient, who gave verbal consent to proceed.  History of Present Illness Hailey Cochran is a 64 year old female with generalized joint hypermobility who presents with several months of worsening right shoulder pain and instability. I have seen her son in the office previously.  Over several months she has had progressively worsening right shoulder pain without a clear inciting event. The pain is constant, mainly superior and anterior, with radiation down the arm. It occurs at rest and worsens with movement, especially overhead or reaching behind her back. Certain motions cause severe pain, at times prompting her to scream. She notes weakness and catching and popping, especially in the morning or with specific positions. She denies numbness, paresthesia, or radicular symptoms. Pain significantly limits dressing, cleaning, and other daily activities, and she has compensated with her left arm, which now has milder similar symptoms.  She has lifelong generalized joint hypermobility with clunking or popping in shoulders and hips but no complete dislocations. Chronic musculoskeletal pain includes fibromyalgia, costochondritis, chronic fatigue syndrome and recurrent tenosynovitis. She also reports temperature dysregulation and orthostatic intolerance with variable heart rate and blood  pressure.  Current medications include meloxicam , ibuprofen as needed, and cyclobenzaprine  for severe pain with partial or unclear benefit and intermittent discontinuation due to side effects. She takes a nightly antihistamine, which helps symptoms. She has not done formal physical therapy for the shoulder. Home immobilization has not improved symptoms.  She has a longstanding positive ANA in the setting of premature ovarian failure but no diagnosed rheumatoid arthritis or Sjogren's syndrome. Her mother has similar symptoms and also uses daily antihistamines. She does follow with rheumatology, managed by Dr. Dolphus - diagnosed with chronic fatigue syndrome/fibromyalgia with + ANA without rheumatologic diagnosis.   Pertinent ROS were reviewed with the patient and found to be negative unless otherwise specified above in HPI.   Assessment & Plan: Visit Diagnoses:  1. Chronic right shoulder pain   2. Benign joint hypermobility   3. Fibromyalgia    Assessment & Plan Chronic right shoulder pain with instability, subluxations in setting of joint hypermobility.  Exam and symptomatology consistent with labral abnormality. No significant bony abnormality or advanced arthritis. Non-surgical management preferred due to connective tissue laxity. - Discussed right shoulder intra-articular steroid injection under ultrasound guidance as a potential option for pain control and to facilitate physical therapy if needed. Tolerated well, advised on post-injection protocol. - Recommended physical therapy with isometric and scapular stabilization exercises to strengthen rotator cuff and periscapular muscles and improve joint stability. Avoid end range of motion. - Provided education on labral symptoms in hypermobile patients and limited role of surgery. - Discussed postural aids such as a body braid for support and provided acquisition information. - Provided anticipatory guidance on symptom chronicity and  importance of ongoing strengthening and postural work.  Generalized joint hypermobility syndrome Generalized joint hypermobility with recurrent subluxations, musculoskeletal pain, and features  suggestive of Ehlers-Danlos syndrome. Ongoing evaluation continues. - Discussed relationship between hypermobility, gastrointestinal symptoms, and autonomic dysfunction, and encouraged continued evaluation with other providers. - Provided information (see AVS) for patient handout and educational information regarding joint hypermobility, relation to EDS, and associated symptomatology - Consider taking Zyrtec generic or allegra /claritin generic along with pepcid daily.   -Consider trying a low-histamine diet  --> could consider medication options: Elavil)   Polyarthralgia Chronic, widespread joint pain consistent with polyarthralgia, likely multifactorial given fibromyalgia, joint hypermobility syndrome and CFS (continue follow-up with Rheum). Pharmacologic management limited by side effects and incomplete efficacy. - Reviewed current pain management strategies, including NSAIDs and muscle relaxants, and discussed limitations and side effects. - Encouraged non-pharmacologic management strategies, including physical therapy and postural modifications, as primary modalities for symptom control. - Warm water exercises, tai chi/yoga, and other functional movements have benefit  Follow-up: Return if symptoms worsen or fail to improve.   Meds & Orders: No orders of the defined types were placed in this encounter.   Orders Placed This Encounter  Procedures   Large Joint Inj: R glenohumeral   XR Shoulder Right   US  Guided Needle Placement - No Linked Charges     Procedures: Large Joint Inj: R glenohumeral on 04/05/2024 9:14 AM Indications: pain Details: 22 G 3.5 in needle, ultrasound-guided posterior approach Medications: 2 mL lidocaine  1 %; 2 mL bupivacaine  0.25 %; 60 mg methylPREDNISolone  acetate 40  MG/ML Outcome: tolerated well, no immediate complications  US -guided glenohumeral joint injection, right shoulder After discussion on risks/benefits/indications, informed verbal consent was obtained. A timeout was then performed. The patient was positioned lying lateral recumbent on examination table. The patient's shoulder was prepped with betadine and multiple alcohol swabs and utilizing ultrasound guidance, the patient's glenohumeral joint was identified on ultrasound. Using ultrasound guidance a 22-gauge, 3.5 inch needle with a mixture of 2:2:1.5 cc's lidocaine :bupivicaine:depomedrol was directed from a lateral to medial direction via in-plane technique into the glenohumeral joint with visualization of appropriate spread of injectate into the joint. Patient tolerated the procedure well without immediate complications.      Procedure, treatment alternatives, risks and benefits explained, specific risks discussed. Consent was given by the patient. Immediately prior to procedure a time out was called to verify the correct patient, procedure, equipment, support staff and site/side marked as required. Patient was prepped and draped in the usual sterile fashion.          Clinical History: No specialty comments available.  She reports that she has never smoked. She has never been exposed to tobacco smoke. She has never used smokeless tobacco.  Recent Labs    12/15/23 0918  HGBA1C 5.9    Objective:    Physical Exam  Gen: Well-appearing, in no acute distress; non-toxic CV: Well-perfused. Warm.  Resp: Breathing unlabored on room air; no wheezing. Psych: Fluid speech in conversation; appropriate affect; normal thought process  *[facility]/Ortho Exam: Physical Exam  *Right shoulder: No redness swelling or effusion.  There is generalized pain to light and deep touch throughout the shoulder joint.  There are no bony blocks to motion although pain in all planes.  There is no gross weakness of  rotator cuff tendons specifically.  There is significant pain with external rotation at the 90/90 position.  Positive drop arm test.  There is + labral shear test, positive apprehension test with pain.   MUSCULOSKELETAL: Pain on palpation of the right shoulder joint. No pain on palpation of the back of the right shoulder blade.  Pain on elevation and abduction of the right arm. Weakness and pain on resisted abduction of the right arm. Pain on resisted external rotation of the right shoulder. Pain on passive extension of the right shoulder.  Imaging: XR Shoulder Right Result Date: 04/05/2024 4 views of the right shoulder including AP, Grashey, scapular Y and axial views were ordered and reviewed by myself today.  Humeral head is well located within the glenohumeral joint.  There is very mild narrowing of the glenohumeral joint.  No significant arthritic change, very mild AC joint arthralgia.  No acute fracture noted.  Past Medical/Family/Surgical/Social History: Medications & Allergies reviewed per EMR, new medications updated. Patient Active Problem List   Diagnosis Date Noted   Mild mitral regurgitation by prior echocardiogram 10/24/2023   Elevated antinuclear antibody (ANA) level    Elevated liver enzymes    Endometriosis    History of endometriosis    Lumbar herniated disc    Paroxysmal SVT (supraventricular tachycardia) 07/20/2023   Peripheral neuropathy 09/14/2021   Autism spectrum disorder 09/14/2021   Adult ADHD 09/14/2021   SOB (shortness of breath) 09/16/2019   Pulmonary nodule 07/08/2019   Anxiety disorder 07/08/2019   COVID-19 long hauler 07/08/2019   Hyperlipidemia 07/08/2019   Dry eyes 07/27/2016   Hyperglycemia 07/26/2016   Palpitations 07/12/2016   Other constipation 04/03/2016   Family history of hepatitis C 04/03/2016   Preventative health care 02/07/2015   Cervical cancer screening 02/02/2015   Overweight 09/29/2014   Anemia    Vitamin B 12 deficiency    Thyroid   disease    Premature ovarian failure    Vitamin D  deficiency 04/22/2014   Onychomycosis 04/09/2014   Low back pain with left-sided sciatica 04/09/2014   Fibromyalgia 04/09/2014   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   Past Medical History:  Diagnosis Date   Adult ADHD 09/14/2021   Anemia    Anxiety disorder 07/08/2019   complex     Autism spectrum disorder 09/14/2021   Cervical cancer screening 02/02/2015   Menarche at 6, stop at 7.5, restarted at 10 Regular and heavy flow and painful from endocmetriosis No history of abnormal pap in past G5P2, s/p 3 miscarriages, 2 svd No history of abnormal MGM, never had a MGM, no concerns, nursed 8 kids, declines MGM for now No concerns today  gyn surgeries: right oophorectomy for an endometrioma LMP at age 38   COVID-19 long hauler 07/08/2019   Dry eyes 07/27/2016   Elevated antinuclear antibody (ANA) level    Elevated liver enzymes    Elevated liver function tests 04/22/2014   Endometriosis    Family history of hepatitis C 04/03/2016   Fibromyalgia    History of endometriosis    Hyperglycemia 07/26/2016   Hyperlipidemia 07/08/2019   Low back pain with left-sided sciatica 04/09/2014   Lumbar herniated disc    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   Onychomycosis 04/09/2014   Other constipation 04/03/2016   Overweight 09/29/2014   Overweight 09/29/2014   Palpitations 07/12/2016   Paroxysmal SVT (supraventricular tachycardia) 07/20/2023   Peripheral neuropathy 09/14/2021   Premature ovarian failure    30s   Preventative health care 02/07/2015   Pulmonary nodule 07/08/2019   SOB (shortness of breath) 09/16/2019   Thyroid  disease    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 [facility] Thyroid    Vitamin B 12 deficiency    Vitamin D  deficiency 04/22/2014   Family History  Problem Relation Age of Onset   Dementia Mother  lewey body   Leukemia Mother    Diabetes Mother        diet  controlled   Hypertension Mother        diet controlled   Parkinson's disease Mother    Hepatitis C Father         2 strains   Cancer Father        hepatocellular carcinoma   Hypertension Father    Bullous pemphigoid Father    Drug abuse Father    Other Sister        fatty liver disease   Hypothyroidism Sister    Gallstones Maternal Aunt    Anemia Maternal Aunt    Muscular dystrophy Maternal Uncle    Muscular dystrophy Paternal Uncle    Diabetes Maternal Grandmother    Dementia Maternal Grandmother    Gallstones Maternal Grandfather    Heart disease Maternal Grandfather        cardiomegaly   Rheum arthritis Paternal Grandmother    Alzheimer's disease Paternal Grandfather    Hypertension Paternal Grandfather    Single kidney Daughter    Healthy Daughter    Healthy Daughter    Rickets Son    Eczema Son    Past Surgical History:  Procedure Laterality Date   CATARACT EXTRACTION, BILATERAL  2025   SALPINGOOPHORECTOMY Right 03/07/1988   endometriosis   Social History   Occupational History   Occupation: Camera Operator  Tobacco Use   Smoking status: Never    Passive exposure: Never   Smokeless tobacco: Never  Vaping Use   Vaping status: Never Used  Substance and Sexual Activity   Alcohol use: No    Alcohol/week: 0.0 standard drinks of alcohol   Drug use: No   Sexual activity: Yes    Comment: lives with children, husband, sister in law and GM, no dietary restrictions avoids pork and alcohol.   "

## 2024-04-05 NOTE — Patient Instructions (Addendum)
*  Ehlers-Danlos Syndrome/Joint Hypermobility - Treatment Thoughts:  Can research Mast Cell Activation Syndrome, Gastrointestinal symptoms with EDS   Often associated with dysautonomia and POTS.    Consider taking Zyrtec generic or allegra /claritin generic along with pepcid daily.    Consider trying a low-histamine diet  - other helpful medication options: Elavil)    Hypermobility-Specialist Physical Therapist.    Check out the book Disjointed Navigating the Diagnosis and Management of Hypermobile Ehlers-Danlos Syndrome and Hypermobility Spectrum Disorders.     *Link to 'Body Danie' support brace:  [email or web address]-eds

## 2024-04-09 ENCOUNTER — Ambulatory Visit

## 2024-04-22 ENCOUNTER — Ambulatory Visit: Admitting: Family Medicine

## 2024-04-23 ENCOUNTER — Ambulatory Visit: Attending: Family Medicine

## 2024-10-02 ENCOUNTER — Ambulatory Visit: Admitting: Physician Assistant

## 2025-02-20 ENCOUNTER — Ambulatory Visit: Admitting: Rheumatology
